# Patient Record
Sex: Male | Born: 1965
Health system: Southern US, Community
[De-identification: ages and names within clinical notes are randomized; demographics above are authoritative.]

## PROBLEM LIST (undated history)

## (undated) DIAGNOSIS — I10 Essential (primary) hypertension: Secondary | ICD-10-CM

## (undated) DIAGNOSIS — E785 Hyperlipidemia, unspecified: Secondary | ICD-10-CM

## (undated) HISTORY — DX: Hyperlipidemia, unspecified: E78.5

## (undated) HISTORY — DX: Essential (primary) hypertension: I10

## (undated) HISTORY — PX: OTHER SURGICAL HISTORY: SHX169

---

## 2005-10-02 ENCOUNTER — Emergency Department: Payer: Self-pay | Admitting: Unknown Physician Specialty

## 2007-09-24 ENCOUNTER — Other Ambulatory Visit: Payer: Self-pay

## 2007-09-24 ENCOUNTER — Emergency Department: Payer: Self-pay | Admitting: Emergency Medicine

## 2015-03-31 ENCOUNTER — Telehealth: Payer: Self-pay | Admitting: Unknown Physician Specialty

## 2015-03-31 MED ORDER — BENAZEPRIL HCL 40 MG PO TABS: 40.0000 mg | ORAL_TABLET | Freq: Every day | ORAL | Status: DC

## 2015-03-31 NOTE — Telephone Encounter (Signed)
Patient was last seen on 06/22/14, practice partner number is [phone], and pharmacy is [facility] in [place].

## 2015-03-31 NOTE — Telephone Encounter (Signed)
E-fax came through for refill on: Rx: Benazepril Copy in basket.

## 2015-03-31 NOTE — Telephone Encounter (Signed)
Needs seen further refills 

## 2015-04-04 NOTE — Telephone Encounter (Signed)
Called and left voicemail on pt's cell. The number listed in EPIC was incorrect. Copied the number from PP to the pt's EPIC chart. Will update once call is returned and appt is scheduled. Thanks.

## 2015-09-20 ENCOUNTER — Encounter: Payer: Self-pay | Admitting: Family Medicine

## 2015-09-20 ENCOUNTER — Ambulatory Visit (INDEPENDENT_AMBULATORY_CARE_PROVIDER_SITE_OTHER): Payer: 59 | Admitting: Family Medicine

## 2015-09-20 VITALS — BP 138/88 | HR 75 | Temp 98.4°F | Ht 69.3 in | Wt 220.0 lb

## 2015-09-20 DIAGNOSIS — M722 Plantar fascial fibromatosis: Secondary | ICD-10-CM

## 2015-09-20 DIAGNOSIS — I10 Essential (primary) hypertension: Secondary | ICD-10-CM | POA: Diagnosis not present

## 2015-09-20 MED ORDER — DILTIAZEM HCL 120 MG PO TABS: 120.0000 mg | ORAL_TABLET | Freq: Every day | ORAL | Status: DC

## 2015-09-20 MED ORDER — BENAZEPRIL HCL 40 MG PO TABS: 40.0000 mg | ORAL_TABLET | Freq: Every day | ORAL | Status: DC

## 2015-09-20 NOTE — Assessment & Plan Note (Signed)
Discuss poor control risk factor modification use of medications will go back to full dose recheck at physical exam

## 2015-09-20 NOTE — Progress Notes (Signed)
   BP 138/88 mmHg  Pulse 75  Temp(Src) 98.4 F (36.9 C)  Ht 5' 9.3" (1.76 m)  Wt 220 lb (99.791 kg)  BMI 32.22 kg/m2  SpO2 98%   Subjective:    Patient ID: Harold [name], male    DOB: [birth year], 50 y.o.   MRN: [id number]  HPI: Harold [name] is a 50 y.o. male  Chief Complaint  Patient presents with  . Hypertension  . Rash  . Diarrhea    happens monthly  . "bump"on right eye lid  . wife says he has some hearing loss  . plantar faciatis   patient with multiple concerns has been taking blood pressure medicines intermittently and only about a half blood pressures been elevated with home readings blood pressure had been controlled when exercising on a regular basis and weight was less ready to increase medicines again Other issues intermittent nonspecific rash in axilla last about a day then goes away occasional diarrhea with no blood Small cyst on his right eyelid First step in the morning hurts his heel with plantar fasciitis these issues were briefly reviewed with patient  Relevant past medical, surgical, family and social history reviewed and updated as indicated. Interim medical history since our last visit reviewed. Allergies and medications reviewed and updated.  Review of Systems  Constitutional: Negative.   Respiratory: Negative.   Cardiovascular: Negative.     Per HPI unless specifically indicated above     Objective:    BP 138/88 mmHg  Pulse 75  Temp(Src) 98.4 F (36.9 C)  Ht 5' 9.3" (1.76 m)  Wt 220 lb (99.791 kg)  BMI 32.22 kg/m2  SpO2 98%  Wt Readings from Last 3 Encounters:  09/20/15 220 lb (99.791 kg)  06/22/14 208 lb (94.348 kg)    Physical Exam  Constitutional: He is oriented to person, place, and time. He appears well-developed and well-nourished. No distress.  HENT:  Head: Normocephalic and atraumatic.  Right Ear: Hearing and external ear normal.  Left Ear: Hearing and external ear normal.  Nose: Nose normal.  Eyes:  Conjunctivae and lids are normal. Right eye exhibits no discharge. Left eye exhibits no discharge. No scleral icterus.  Cardiovascular: Normal rate, regular rhythm and normal heart sounds.   Pulmonary/Chest: Effort normal and breath sounds normal. No respiratory distress.  Musculoskeletal: Normal range of motion.  Neurological: He is alert and oriented to person, place, and time.  Skin: Skin is intact. No rash noted.  Psychiatric: He has a normal mood and affect. His speech is normal and behavior is normal. Judgment and thought content normal. Cognition and memory are normal.    No results found for this or any previous visit.    Assessment & Plan:   Problem List Items Addressed This Visit      Cardiovascular and Mediastinum   Essential hypertension - Primary    Discuss poor control risk factor modification use of medications will go back to full dose recheck at physical exam      Relevant Medications   benazepril (LOTENSIN) 40 MG tablet   diltiazem (CARDIZEM) 120 MG tablet    Other Visit Diagnoses    Plantar fasciitis        Discussed plantar fasciitis care and treatment        Follow up plan: Return in about 3 months (around 12/18/2015) for Physical Exam.

## 2015-10-23 ENCOUNTER — Encounter: Payer: Self-pay | Admitting: Family Medicine

## 2015-12-06 ENCOUNTER — Telehealth: Payer: Self-pay

## 2015-12-06 MED ORDER — BENAZEPRIL HCL 40 MG PO TABS: 40.0000 mg | ORAL_TABLET | Freq: Every day | ORAL | Status: DC

## 2015-12-06 MED ORDER — DILTIAZEM HCL 120 MG PO TABS: 120.0000 mg | ORAL_TABLET | Freq: Every day | ORAL | Status: DC

## 2015-12-06 NOTE — Telephone Encounter (Signed)
Pharmacy is requesting a 90 day supply on Benazepril

## 2015-12-06 NOTE — Telephone Encounter (Signed)
Please change Diltiazem 120mg  to a 90 day Rx  [facility]

## 2015-12-27 ENCOUNTER — Encounter: Payer: Self-pay | Admitting: Family Medicine

## 2015-12-27 ENCOUNTER — Ambulatory Visit (INDEPENDENT_AMBULATORY_CARE_PROVIDER_SITE_OTHER): Payer: 59 | Admitting: Family Medicine

## 2015-12-27 VITALS — BP 114/80 | HR 67 | Temp 98.2°F | Ht 69.7 in | Wt 217.0 lb

## 2015-12-27 DIAGNOSIS — I10 Essential (primary) hypertension: Secondary | ICD-10-CM | POA: Diagnosis not present

## 2015-12-27 DIAGNOSIS — Z0000 Encounter for general adult medical examination without abnormal findings: Secondary | ICD-10-CM | POA: Diagnosis not present

## 2015-12-27 LAB — URINALYSIS, ROUTINE W REFLEX MICROSCOPIC
BILIRUBIN UA: NEGATIVE
GLUCOSE, UA: NEGATIVE
KETONES UA: NEGATIVE
Leukocytes, UA: NEGATIVE
Nitrite, UA: NEGATIVE
RBC UA: NEGATIVE
SPEC GRAV UA: 1.015 (ref 1.005–1.030)
UUROB: 0.2 mg/dL (ref 0.2–1.0)
pH, UA: 6 (ref 5.0–7.5)

## 2015-12-27 MED ORDER — BENAZEPRIL HCL 40 MG PO TABS: 40.0000 mg | ORAL_TABLET | Freq: Every day | ORAL | Status: DC

## 2015-12-27 MED ORDER — TRIAMCINOLONE ACETONIDE 0.1 % EX CREA: 1.0000 "application " | TOPICAL_CREAM | Freq: Two times a day (BID) | CUTANEOUS | Status: DC

## 2015-12-27 MED ORDER — DILTIAZEM HCL 120 MG PO TABS: 120.0000 mg | ORAL_TABLET | Freq: Every day | ORAL | Status: DC

## 2015-12-27 NOTE — Progress Notes (Signed)
BP 114/80 mmHg  Pulse 67  Temp(Src) 98.2 F (36.8 C)  Ht 5' 9.7" (1.77 m)  Wt 217 lb (98.431 kg)  BMI 31.42 kg/m2  SpO2 98%   Subjective:    Patient ID: Harold [name], male    DOB: 12/14/1965, 50 y.o.   MRN: [id number]  HPI: Harold [name] is a 50 y.o. male  Chief Complaint  Patient presents with  . Annual Exam  scalp ongoing about 6 mo and sprreading dry patch rt ear area.  BP no issues  Relevant past medical, surgical, family and social history reviewed and updated as indicated. Interim medical history since our last visit reviewed. Allergies and medications reviewed and updated.  Review of Systems  Constitutional: Negative.   HENT: Negative.   Eyes: Negative.   Respiratory: Negative.   Cardiovascular: Negative.   Gastrointestinal: Negative.   Endocrine: Negative.   Genitourinary: Negative.   Musculoskeletal: Negative.   Skin: Negative.   Allergic/Immunologic: Negative.   Neurological: Negative.   Hematological: Negative.   Psychiatric/Behavioral: Negative.     Per HPI unless specifically indicated above     Objective:    BP 114/80 mmHg  Pulse 67  Temp(Src) 98.2 F (36.8 C)  Ht 5' 9.7" (1.77 m)  Wt 217 lb (98.431 kg)  BMI 31.42 kg/m2  SpO2 98%  Wt Readings from Last 3 Encounters:  12/27/15 217 lb (98.431 kg)  09/20/15 220 lb (99.791 kg)  06/22/14 208 lb (94.348 kg)    Physical Exam  Constitutional: He is oriented to person, place, and time. He appears well-developed and well-nourished.  HENT:  Head: Normocephalic and atraumatic.  Right Ear: External ear normal.  Left Ear: External ear normal.  Eyes: Conjunctivae and EOM are normal. Pupils are equal, round, and reactive to light.  Neck: Normal range of motion. Neck supple.  Cardiovascular: Normal rate, regular rhythm, normal heart sounds and intact distal pulses.   Pulmonary/Chest: Effort normal and breath sounds normal.  Abdominal: Soft. Bowel sounds are normal. There is no  splenomegaly or hepatomegaly.  Genitourinary: Rectum normal, prostate normal and penis normal.  Musculoskeletal: Normal range of motion.  Neurological: He is alert and oriented to person, place, and time. He has normal reflexes.  Skin: No rash noted. No erythema.  Scalp area with some psoriasis changes.  Psychiatric: He has a normal mood and affect. His behavior is normal. Judgment and thought content normal.    No results found for this or any previous visit.    Assessment & Plan:   Problem List Items Addressed This Visit      Cardiovascular and Mediastinum   Essential hypertension    The current medical regimen is effective;  continue present plan and medications.       Relevant Medications   benazepril (LOTENSIN) 40 MG tablet   diltiazem (CARDIZEM) 120 MG tablet    Other Visit Diagnoses    Routine general medical examination at a health care facility    -  Primary    Relevant Orders    CBC with Differential/Platelet    Comprehensive metabolic panel    Lipid Panel w/o Chol/HDL Ratio    PSA    TSH    Urinalysis, Routine w reflex microscopic ([facility])      Discussed psoriasis care and treatment and use of steroid cream   Follow up plan: Return in about 6 months (around 06/28/2016), or if symptoms worsen or fail to improve, for bmp.

## 2015-12-27 NOTE — Assessment & Plan Note (Signed)
The current medical regimen is effective;  continue present plan and medications.  

## 2015-12-27 NOTE — Addendum Note (Signed)
Addended by: Lurlean HornsWILSON, Malvin Morrish H on: 12/27/2015 08:43 AM   Modules accepted: K[employer]

## 2015-12-28 ENCOUNTER — Encounter: Payer: Self-pay | Admitting: Family Medicine

## 2015-12-28 LAB — CBC WITH DIFFERENTIAL/PLATELET
BASOS: 0 %
Basophils Absolute: 0 10*3/uL (ref 0.0–0.2)
EOS (ABSOLUTE): 0 10*3/uL (ref 0.0–0.4)
EOS: 0 %
HEMATOCRIT: 44.1 % (ref 37.5–51.0)
Hemoglobin: 14.6 g/dL (ref 12.6–17.7)
IMMATURE GRANS (ABS): 0 10*3/uL (ref 0.0–0.1)
IMMATURE GRANULOCYTES: 0 %
LYMPHS: 18 %
Lymphocytes Absolute: 1.3 10*3/uL (ref 0.7–3.1)
MCH: 28.9 pg (ref 26.6–33.0)
MCHC: 33.1 g/dL (ref 31.5–35.7)
MCV: 87 fL (ref 79–97)
MONOCYTES: 6 %
MONOS ABS: 0.4 10*3/uL (ref 0.1–0.9)
NEUTROS PCT: 76 %
Neutrophils Absolute: 5.3 10*3/uL (ref 1.4–7.0)
PLATELETS: 227 10*3/uL (ref 150–379)
RBC: 5.05 x10E6/uL (ref 4.14–5.80)
RDW: 13.7 % (ref 12.3–15.4)
WBC: 7.1 10*3/uL (ref 3.4–10.8)

## 2015-12-28 LAB — COMPREHENSIVE METABOLIC PANEL
ALBUMIN: 4.6 g/dL (ref 3.5–5.5)
ALK PHOS: 58 IU/L (ref 39–117)
ALT: 39 IU/L (ref 0–44)
AST: 31 IU/L (ref 0–40)
Albumin/Globulin Ratio: 1.8 (ref 1.2–2.2)
BILIRUBIN TOTAL: 0.8 mg/dL (ref 0.0–1.2)
BUN / CREAT RATIO: 12 (ref 9–20)
BUN: 14 mg/dL (ref 6–24)
CHLORIDE: 102 mmol/L (ref 96–106)
CO2: 24 mmol/L (ref 18–29)
CREATININE: 1.19 mg/dL (ref 0.76–1.27)
Calcium: 9.5 mg/dL (ref 8.7–10.2)
GFR calc Af Amer: 84 mL/min/{1.73_m2} (ref >59)
GFR calc non Af Amer: 72 mL/min/{1.73_m2} (ref >59)
Globulin, Total: 2.5 g/dL (ref 1.5–4.5)
Glucose: 95 mg/dL (ref 65–99)
Potassium: 4.4 mmol/L (ref 3.5–5.2)
Sodium: 143 mmol/L (ref 134–144)
Total Protein: 7.1 g/dL (ref 6.0–8.5)

## 2015-12-28 LAB — LIPID PANEL W/O CHOL/HDL RATIO
Cholesterol, Total: 209 mg/dL — ABNORMAL HIGH (ref 100–199)
HDL: 53 mg/dL (ref >39)
LDL Calculated: 128 mg/dL — ABNORMAL HIGH (ref 0–99)
Triglycerides: 138 mg/dL (ref 0–149)
VLDL Cholesterol Cal: 28 mg/dL (ref 5–40)

## 2015-12-28 LAB — TSH: TSH: 1.03 u[IU]/mL (ref 0.450–4.500)

## 2015-12-28 LAB — PSA: PROSTATE SPECIFIC AG, SERUM: 0.7 ng/mL (ref 0.0–4.0)

## 2016-01-22 ENCOUNTER — Emergency Department
Admission: EM | Admit: 2016-01-22 | Discharge: 2016-01-22 | Disposition: A | Discharge status: Home / Self Care | Payer: 59 | Attending: Emergency Medicine | Admitting: Emergency Medicine

## 2016-01-22 ENCOUNTER — Encounter: Payer: Self-pay | Admitting: Emergency Medicine

## 2016-01-22 DIAGNOSIS — Y929 Unspecified place or not applicable: Secondary | ICD-10-CM | POA: Diagnosis not present

## 2016-01-22 DIAGNOSIS — S0990XA Unspecified injury of head, initial encounter: Secondary | ICD-10-CM | POA: Diagnosis not present

## 2016-01-22 DIAGNOSIS — W108XXA Fall (on) (from) other stairs and steps, initial encounter: Secondary | ICD-10-CM | POA: Insufficient documentation

## 2016-01-22 DIAGNOSIS — Y999 Unspecified external cause status: Secondary | ICD-10-CM | POA: Insufficient documentation

## 2016-01-22 DIAGNOSIS — I10 Essential (primary) hypertension: Secondary | ICD-10-CM | POA: Diagnosis not present

## 2016-01-22 DIAGNOSIS — Y939 Activity, unspecified: Secondary | ICD-10-CM | POA: Insufficient documentation

## 2016-01-22 LAB — BASIC METABOLIC PANEL
ANION GAP: 7 (ref 5–15)
BUN: 15 mg/dL (ref 6–20)
CALCIUM: 9.5 mg/dL (ref 8.9–10.3)
CO2: 27 mmol/L (ref 22–32)
Chloride: 108 mmol/L (ref 101–111)
Creatinine, Ser: 1.18 mg/dL (ref 0.61–1.24)
GFR calc Af Amer: >60 mL/min (ref >60)
GLUCOSE: 94 mg/dL (ref 65–99)
Potassium: 4.2 mmol/L (ref 3.5–5.1)
Sodium: 142 mmol/L (ref 135–145)

## 2016-01-22 LAB — CBC
HCT: 43.9 % (ref 40.0–52.0)
HEMOGLOBIN: 14.5 g/dL (ref 13.0–18.0)
MCH: 28.9 pg (ref 26.0–34.0)
MCHC: 33 g/dL (ref 32.0–36.0)
MCV: 87.6 fL (ref 80.0–100.0)
PLATELETS: 224 10*3/uL (ref 150–440)
RBC: 5.01 MIL/uL (ref 4.40–5.90)
RDW: 13.4 % (ref 11.5–14.5)
WBC: 7.5 10*3/uL (ref 3.8–10.6)

## 2016-01-22 NOTE — Discharge Instructions (Signed)
Head Injury, Adult °You have a head injury. Headaches and throwing up (vomiting) are common after a head injury. It should be easy to wake up from sleeping. Sometimes you must stay in the hospital. Most problems happen within the first 24 hours. Side effects may occur up to 7-10 days after the injury.  °WHAT ARE THE TYPES OF HEAD INJURIES? °Head injuries can be as minor as a bump. Some head injuries can be more severe. More severe head injuries include: °· A jarring injury to the brain (concussion). °· A bruise of the brain (contusion). This mean there is bleeding in the brain that can cause swelling. °· A cracked skull (skull fracture). °· Bleeding in the brain that collects, clots, and forms a bump (hematoma). °WHEN SHOULD I GET HELP RIGHT AWAY?  °· You are confused or sleepy. °· You cannot be woken up. °· You feel sick to your stomach (nauseous) or keep throwing up (vomiting). °· Your dizziness or unsteadiness is getting worse. °· You have very bad, lasting headaches that are not helped by medicine. Take medicines only as told by your doctor. °· You cannot use your arms or legs like normal. °· You cannot walk. °· You notice changes in the black spots in the center of the colored part of your eye (pupil). °· You have clear or bloody fluid coming from your nose or ears. °· You have trouble seeing. °During the next 24 hours after the injury, you must stay with someone who can watch you. This person should get help right away (call 911 in the [place] if you start to shake and are not able to control it (have seizures), you pass out, or you are unable to wake up. °HOW CAN I PREVENT A HEAD INJURY IN THE FUTURE? °· Wear seat belts. °· Wear a helmet while bike riding and playing sports like football. °· Stay away from dangerous activities around the house. °WHEN CAN I RETURN TO NORMAL ACTIVITIES AND ATHLETICS? °See your doctor before doing these activities. You should not do normal activities or play contact sports until 1  week after the following symptoms have stopped: °· Headache that does not go away. °· Dizziness. °· Poor attention. °· Confusion. °· Memory problems. °· Sickness to your stomach or throwing up. °· Tiredness. °· Fussiness. °· Bothered by bright lights or loud noises. °· Anxiousness or depression. °· Restless sleep. °MAKE SURE YOU:  °· Understand these instructions. °· Will watch your condition. °· Will get help right away if you are not doing well or get worse. °  °This information is not intended to replace advice given to you by your health care provider. Make sure you discuss any questions you have with your health care provider. °  °Document Released: 07/18/2008 Document Revised: 08/26/2014 Document Reviewed: 04/12/2013 °Elsevier Interactive Patient Education ©2016 [employer] ° °

## 2016-01-22 NOTE — ED Notes (Signed)
Pt here with c/o episodes of dizziness, "like he is going to pass out," states the dizziness made him fall down the steps yesterday, hitting his head on the steps going down, states he had an episode last week where he became very hot, was not sweating, heart rate elevated, denies cp, sob.

## 2016-01-22 NOTE — ED Notes (Signed)
AAOx3.  Skin warm and dry. NAD.  Ambulates with easy and steady gait.  MAE equally and strong. 

## 2016-01-22 NOTE — ED Provider Notes (Signed)
[facility] Emergency Department Provider Note  ____________________________________________    I have reviewed the triage vital signs and the nursing notes.   HISTORY  Chief Complaint Head injury   HPI Harold [name] is a 50 y.o. male who presents after a fall yesterday.Patient reports that he "had a spasm "and fell down the stairs yesterday and hit his head. Today he had a mild headache and his wife wanted him to be checked out. He denies neuro deficits. No change in vision. He reports he frequently has spasms in the legs which causes him to lose his balance. This has been happening for many years. He denies dizziness to me in fact reports he feels very well. No chest pain or shortness of breath.     Past Medical History  Diagnosis Date  . Hypertension     Patient Active Problem List   Diagnosis Date Noted  . Essential hypertension 09/20/2015    Past Surgical History  Procedure Laterality Date  . Wisdom teeth removed      Current Outpatient Rx  Name  Route  Sig  Dispense  Refill  . benazepril (LOTENSIN) 40 MG tablet   Oral   Take 1 tablet (40 mg total) by mouth daily.   90 tablet   4   . diltiazem (CARDIZEM) 120 MG tablet   Oral   Take 1 tablet (120 mg total) by mouth daily.   90 tablet   4     Allergies Review of patient's allergies indicates no known allergies.  Family History  Problem Relation Age of Onset  . Parkinson's disease Mother   . Hypertension Mother   . Alcohol abuse Father   . Hypertension Father     Social History Social History  Substance Use Topics  . Smoking status: Never Smoker   . Smokeless tobacco: Never Used  . Alcohol Use: No    Review of Systems  Constitutional: Negative for fever. Eyes: Negative for redness ENT: Negative for sore throat Cardiovascular: Negative for chest pain Respiratory: Negative for shortness of breath. Gastrointestinal: Negative for abdominal pain Genitourinary:  Negative for dysuria. Musculoskeletal: Negative for back pain. Skin: Negative for rash. Neurological: Negative for focal weakness Psychiatric: Mild anxiety    ____________________________________________   PHYSICAL EXAM:  VITAL SIGNS: ED Triage Vitals  Enc Vitals Group     BP 01/22/16 1015 132/82 mmHg     Pulse Rate 01/22/16 1015 66     Resp 01/22/16 1015 18     Temp 01/22/16 1015 98.8 F (37.1 C)     Temp Source 01/22/16 1015 Oral     SpO2 01/22/16 1015 97 %     Weight 01/22/16 1015 220 lb (99.791 kg)     Height 01/22/16 1015 5\' 10"  (1.778 m)     Head Cir --      Peak Flow --      Pain Score 01/22/16 1015 2     Pain Loc --      Pain Edu? --      Excl. in GC? --      Constitutional: Alert and oriented. Well appearing and in no distress.  Eyes: Conjunctivae are normal. No erythema or injection ENT   Head: Normocephalic and atraumatic.   Mouth/Throat: Mucous membranes are moist. Cardiovascular: Normal rate, regular rhythm. Normal and symmetric distal pulses are present in the upper extremities. No murmurs or rubs  Respiratory: Normal respiratory effort without tachypnea nor retractions. Breath sounds are clear and equal  bilaterally.  Gastrointestinal: Soft and non-tender in all quadrants. No distention. There is no CVA tenderness. Genitourinary: deferred Musculoskeletal: Nontender with normal range of motion in all extremities. No lower extremity tenderness nor edema. Neurologic:  Normal speech and language. No gross focal neurologic deficits are appreciated. Skin:  Skin is warm, dry and intact. No rash noted. Psychiatric: Mood and affect are normal. Patient exhibits appropriate insight and judgment.  ____________________________________________    LABS (pertinent positives/negatives)  Labs Reviewed  BASIC METABOLIC PANEL  CBC    ____________________________________________   EKG ED ECG REPORT I, Jene Every, the attending physician, personally  viewed and interpreted this ECG.  Date: 01/22/2016 EKG Time: 10:11 AM Rate: 64 Rhythm: normal sinus rhythm QRS Axis: normal Intervals: normal ST/T Wave abnormalities: normal Conduction Disturbances: none Narrative Interpretation: unremarkable    ____________________________________________    RADIOLOGY  None  ____________________________________________   PROCEDURES  Procedure(s) performed: none  Critical Care performed: none  ____________________________________________   INITIAL IMPRESSION / ASSESSMENT AND PLAN / ED COURSE  Pertinent labs & imaging results that were available during my care of the patient were reviewed by me and considered in my medical decision making (see chart for details).  Patient well-appearing and in no distress. Neuro intact. Denies headache. No dizziness. EKG is normal. Lab work is benign. No chest pain. Do not feel CT head is necessary given the patient feels very well greater than 24 hours after the event. Patient and his wife are comfortable not doing a CT scan after our discussion. He has no dizziness and vital signs are normal. Recommend outpatient follow-up with his PCP. Return precautions discussed  ____________________________________________   FINAL CLINICAL IMPRESSION(S) / ED DIAGNOSES  Final diagnoses:  Closed head injury, initial encounter          Jene Every, MD 01/22/16 1524

## 2016-02-01 ENCOUNTER — Encounter: Payer: Self-pay | Admitting: Family Medicine

## 2016-02-01 ENCOUNTER — Ambulatory Visit (INDEPENDENT_AMBULATORY_CARE_PROVIDER_SITE_OTHER): Payer: 59 | Admitting: Family Medicine

## 2016-02-01 VITALS — BP 121/82 | HR 79 | Temp 98.8°F | Wt 219.0 lb

## 2016-02-01 DIAGNOSIS — K529 Noninfective gastroenteritis and colitis, unspecified: Secondary | ICD-10-CM | POA: Diagnosis not present

## 2016-02-01 DIAGNOSIS — R252 Cramp and spasm: Secondary | ICD-10-CM | POA: Diagnosis not present

## 2016-02-01 NOTE — Assessment & Plan Note (Signed)
Some concerning features with it waking him up and having occasional blood. No weight loss. Unclear IBS or possible IBD. Will get him into GI. Referral generated today. Call with concerns.

## 2016-02-01 NOTE — Progress Notes (Signed)
BP 121/82 mmHg  Pulse 79  Temp(Src) 98.8 F (37.1 C)  Wt 219 lb (99.338 kg)  SpO2 97%   Subjective:    Patient ID: Harold [name], male    DOB: August 22, 1965, 50 y.o.   MRN: [id number]  HPI: Harold [name] is a 50 y.o. male  Chief Complaint  Patient presents with  . ER Follow Up   ER FOLLOW UP- had a spasm on 01/21/16 and fell down the stairs and hit his head. Had a mild headache, so his wife brought him into the ER on 6/5 for evaluation. Normal neuro exam. States that he frequently has spasms in his legs that make him lose his balance for years.  Time since discharge: 10 days Hospital/facility: [facility] Diagnosis: closed head injury Procedures/tests: Labs- normal, EKG- normal, no imaging Consultants: None New medications: None Discharge instructions: Follow up here  Status: better  States that he has twitches throughout his body. Occasionally worse than normal. He states that it was all over his body. A couple of times a month will have diarrhea for a few days to a couple of weeks. Has not really mentioned his diarrhea. Has not seen anyone for it. Diarrhea had been going for 2 weeks around that time. Unsure when the diarrhea comes on. When doesn't have the diarrhea, normal bowel movements. When he has it has up to 4-6 BM a day. Wakes him up at night occasionally. + belly pain- generalized, not able to describe it. Not sure if it's cramping. + Bowel urgency. + blood in his stool about 2x.   Was having more issues with diarrhea around the time that his legs spasmed and he fell down.   Relevant past medical, surgical, family and social history reviewed and updated as indicated. Interim medical history since our last visit reviewed. Allergies and medications reviewed and updated.  Review of Systems  Constitutional: Negative.   Respiratory: Negative.   Cardiovascular: Negative.   Gastrointestinal: Positive for abdominal pain, diarrhea and blood in stool. Negative for nausea,  vomiting, constipation, abdominal distention, anal bleeding and rectal pain.  Genitourinary: Negative.   Psychiatric/Behavioral: Negative.     Per HPI unless specifically indicated above     Objective:    BP 121/82 mmHg  Pulse 79  Temp(Src) 98.8 F (37.1 C)  Wt 219 lb (99.338 kg)  SpO2 97%  Wt Readings from Last 3 Encounters:  02/01/16 219 lb (99.338 kg)  01/22/16 220 lb (99.791 kg)  12/27/15 217 lb (98.431 kg)    Physical Exam  Constitutional: He is oriented to person, place, and time. He appears well-developed and well-nourished. No distress.  HENT:  Head: Normocephalic and atraumatic.  Right Ear: Hearing normal.  Left Ear: Hearing normal.  Nose: Nose normal.  Eyes: Conjunctivae and lids are normal. Right eye exhibits no discharge. Left eye exhibits no discharge. No scleral icterus.  Cardiovascular: Normal rate, regular rhythm, normal heart sounds and intact distal pulses.  Exam reveals no gallop and no friction rub.   No murmur heard. Pulmonary/Chest: Effort normal and breath sounds normal. No respiratory distress. He has no wheezes. He has no rales. He exhibits no tenderness.  Abdominal: Soft. Bowel sounds are normal. He exhibits no distension and no mass. There is no tenderness. There is no rebound and no guarding.  Musculoskeletal: Normal range of motion.  Neurological: He is alert and oriented to person, place, and time.  Skin: Skin is warm, dry and intact. No rash noted. He is not  diaphoretic. No erythema. No pallor.  Psychiatric: He has a normal mood and affect. His speech is normal and behavior is normal. Judgment and thought content normal. Cognition and memory are normal.  Nursing note and vitals reviewed.   Results for orders placed or performed during the hospital encounter of 01/22/16  Basic metabolic panel  Result Value Ref Range   Sodium 142 135 - 145 mmol/L   Potassium 4.2 3.5 - 5.1 mmol/L   Chloride 108 101 - 111 mmol/L   CO2 27 22 - 32 mmol/L    Glucose, Bld 94 65 - 99 mg/dL   BUN 15 6 - 20 mg/dL   Creatinine, Ser 2.13 0.61 - 1.24 mg/dL   Calcium 9.5 8.9 - 08.6 mg/dL   GFR calc non Af Amer >60 >60 mL/min   GFR calc Af Amer >60 >60 mL/min   Anion gap 7 5 - 15  CBC  Result Value Ref Range   WBC 7.5 3.8 - 10.6 K/uL   RBC 5.01 4.40 - 5.90 MIL/uL   Hemoglobin 14.5 13.0 - 18.0 g/dL   HCT 57.8 46.9 - 62.9 %   MCV 87.6 80.0 - 100.0 fL   MCH 28.9 26.0 - 34.0 pg   MCHC 33.0 32.0 - 36.0 g/dL   RDW 52.8 41.3 - 24.4 %   Platelets 224 150 - 440 K/uL      Assessment & Plan:   Problem List Items Addressed This Visit      Digestive   Chronic diarrhea - Primary    Some concerning features with it waking him up and having occasional blood. No weight loss. Unclear IBS or possible IBD. Will get him into GI. Referral generated today. Call with concerns.       Relevant Orders   Ambulatory referral to Gastroenterology    Other Visit Diagnoses    Cramp of both lower extremities        Likely due to electrolyte imbalance with his diarrhea. Will check labs. To drink a gatorade every day he has diarrhea. Call with concerns.     Relevant Orders    Comprehensive metabolic panel    VITAMIN D 25 Hydroxy (Vit-D Deficiency, Fractures)    Phosphorus    Magnesium        Follow up plan: Return if symptoms worsen or fail to improve.

## 2016-02-02 ENCOUNTER — Telehealth: Payer: Self-pay | Admitting: Family Medicine

## 2016-02-02 DIAGNOSIS — E559 Vitamin D deficiency, unspecified: Secondary | ICD-10-CM | POA: Insufficient documentation

## 2016-02-02 LAB — COMPREHENSIVE METABOLIC PANEL
ALBUMIN: 4.6 g/dL (ref 3.5–5.5)
ALK PHOS: 60 IU/L (ref 39–117)
ALT: 24 IU/L (ref 0–44)
AST: 20 IU/L (ref 0–40)
Albumin/Globulin Ratio: 2.2 (ref 1.2–2.2)
BILIRUBIN TOTAL: 1 mg/dL (ref 0.0–1.2)
BUN / CREAT RATIO: 10 (ref 9–20)
BUN: 12 mg/dL (ref 6–24)
CO2: 22 mmol/L (ref 18–29)
CREATININE: 1.18 mg/dL (ref 0.76–1.27)
Calcium: 9.6 mg/dL (ref 8.7–10.2)
Chloride: 102 mmol/L (ref 96–106)
GFR calc non Af Amer: 73 mL/min/{1.73_m2} (ref >59)
GFR, EST AFRICAN AMERICAN: 84 mL/min/{1.73_m2} (ref >59)
GLOBULIN, TOTAL: 2.1 g/dL (ref 1.5–4.5)
GLUCOSE: 91 mg/dL (ref 65–99)
Potassium: 4.3 mmol/L (ref 3.5–5.2)
SODIUM: 143 mmol/L (ref 134–144)
TOTAL PROTEIN: 6.7 g/dL (ref 6.0–8.5)

## 2016-02-02 LAB — PHOSPHORUS: Phosphorus: 3 mg/dL (ref 2.5–4.5)

## 2016-02-02 LAB — MAGNESIUM: Magnesium: 2.2 mg/dL (ref 1.6–2.3)

## 2016-02-02 LAB — VITAMIN D 25 HYDROXY (VIT D DEFICIENCY, FRACTURES): Vit D, 25-Hydroxy: 18.4 ng/mL — ABNORMAL LOW (ref 30.0–100.0)

## 2016-02-02 MED ORDER — VITAMIN D (ERGOCALCIFEROL) 1.25 MG (50000 UNIT) PO CAPS: 50000.0000 [IU] | ORAL_CAPSULE | ORAL | Status: DC

## 2016-02-02 NOTE — Telephone Encounter (Signed)
Patient notified

## 2016-02-02 NOTE — Telephone Encounter (Signed)
Please let him know that his labs were normal except his vitamin D which is quite low. I've sent him through a weekly medicine. We'll recheck it at his follow up.

## 2016-07-01 ENCOUNTER — Encounter: Payer: Self-pay | Admitting: Family Medicine

## 2016-07-01 ENCOUNTER — Ambulatory Visit (INDEPENDENT_AMBULATORY_CARE_PROVIDER_SITE_OTHER): Payer: 59 | Admitting: Family Medicine

## 2016-07-01 VITALS — BP 116/80 | HR 73 | Temp 98.5°F | Wt 215.0 lb

## 2016-07-01 DIAGNOSIS — K529 Noninfective gastroenteritis and colitis, unspecified: Secondary | ICD-10-CM

## 2016-07-01 DIAGNOSIS — I10 Essential (primary) hypertension: Secondary | ICD-10-CM | POA: Diagnosis not present

## 2016-07-01 DIAGNOSIS — E559 Vitamin D deficiency, unspecified: Secondary | ICD-10-CM | POA: Diagnosis not present

## 2016-07-01 NOTE — Assessment & Plan Note (Signed)
The current medical regimen is effective;  continue present plan and medications.  

## 2016-07-01 NOTE — Assessment & Plan Note (Signed)
Reviewed and patient stable and doing well.

## 2016-07-01 NOTE — Progress Notes (Signed)
BP 116/80   Pulse 73   Temp 98.5 F (36.9 C)   Wt 215 lb (97.5 kg)   SpO2 97%   BMI 30.85 kg/m    Subjective:    Patient ID: Harold [name], male  Harold [name]  DOB: 1966-05-24, 50 y.o.   MRN: [id number]  HPI: Harold HeaterOssie O Dubois [name] is a 50 y.o. male  Chief Complaint  Patient presents with  . Hypertension   Patient follow-up hypertension no complaints from medications taking diltiazem 60 mg one half of 120 and also Benzapril 20 mg a half of 40. Good control of blood pressure with no side effects or issues. Has tried reducing and he gets too high tried increasing and gets too low as found good balance.  Patient's irritable bowel is steady with only every other month of severe diarrhea otherwise mild loose stools 2-3 times a week otherwise normal.  Relevant past medical, surgical, family and social history reviewed and updated as indicated. Interim medical history since our last visit reviewed. Allergies and medications reviewed and updated.  Review of Systems  Constitutional: Negative.   Respiratory: Negative.   Cardiovascular: Negative.     Per HPI unless specifically indicated above     Objective:    BP 116/80   Pulse 73   Temp 98.5 F (36.9 C)   Wt 215 lb (97.5 kg)   SpO2 97%   BMI 30.85 kg/m   Wt Readings from Last 3 Encounters:  07/01/16 215 lb (97.5 kg)  02/01/16 219 lb (99.3 kg)  01/22/16 220 lb (99.8 kg)    Physical Exam  Constitutional: He is oriented to person, place, and time. He appears well-developed and well-nourished. No distress.  HENT:  Head: Normocephalic and atraumatic.  Right Ear: Hearing normal.  Left Ear: Hearing normal.  Nose: Nose normal.  Eyes: Conjunctivae and lids are normal. Right eye exhibits no discharge. Left eye exhibits no discharge. No scleral icterus.  Cardiovascular: Normal rate, regular rhythm and normal heart sounds.   Pulmonary/Chest: Effort normal and breath sounds normal. No respiratory distress.  Musculoskeletal: Normal  range of motion.  Neurological: He is alert and oriented to person, place, and time.  Skin: Skin is intact. No rash noted.  Psychiatric: He has a normal mood and affect. His speech is normal and behavior is normal. Judgment and thought content normal. Cognition and memory are normal.    Results for orders placed or performed in visit on 02/01/16  Comprehensive metabolic panel  Result Value Ref Range   Glucose 91 65 - 99 mg/dL   BUN 12 6 - 24 mg/dL   Creatinine, Ser 3.081.18 0.76 - 1.27 mg/dL   GFR calc non Af Amer 73 >59 mL/min/1.73   GFR calc Af Amer 84 >59 mL/min/1.73   BUN/Creatinine Ratio 10 9 - 20   Sodium 143 134 - 144 mmol/L   Potassium 4.3 3.5 - 5.2 mmol/L   Chloride 102 96 - 106 mmol/L   CO2 22 18 - 29 mmol/L   Calcium 9.6 8.7 - 10.2 mg/dL   Total Protein 6.7 6.0 - 8.5 g/dL   Albumin 4.6 3.5 - 5.5 g/dL   Globulin, Total 2.1 1.5 - 4.5 g/dL   Albumin/Globulin Ratio 2.2 1.2 - 2.2   Bilirubin Total 1.0 0.0 - 1.2 mg/dL   Alkaline Phosphatase 60 39 - 117 IU/L   AST 20 0 - 40 IU/L   ALT 24 0 - 44 IU/L  VITAMIN D 25 Hydroxy (Vit-D Deficiency, Fractures)  Result Value Ref Range   Vit D, 25-Hydroxy 18.4 (L) 30.0 - 100.0 ng/mL  Phosphorus  Result Value Ref Range   Phosphorus 3.0 2.5 - 4.5 mg/dL  Magnesium  Result Value Ref Range   Magnesium 2.2 1.6 - 2.3 mg/dL      Assessment & Plan:   Problem List Items Addressed This Visit      Cardiovascular and Mediastinum   Essential hypertension - Primary    The current medical regimen is effective;  continue present plan and medications.       Relevant Orders   Basic metabolic panel     Digestive   Chronic diarrhea    Reviewed and patient stable and doing well.        Other   Vitamin D deficiency       Follow up plan: Return in about 6 months (around 12/29/2016) for Physical Exam.

## 2016-07-02 ENCOUNTER — Encounter: Payer: Self-pay | Admitting: Family Medicine

## 2016-07-02 LAB — BASIC METABOLIC PANEL
BUN / CREAT RATIO: 15 (ref 9–20)
BUN: 16 mg/dL (ref 6–24)
CO2: 22 mmol/L (ref 18–29)
CREATININE: 1.05 mg/dL (ref 0.76–1.27)
Calcium: 9.9 mg/dL (ref 8.7–10.2)
Chloride: 103 mmol/L (ref 96–106)
GFR calc Af Amer: 97 mL/min/{1.73_m2} (ref >59)
GFR, EST NON AFRICAN AMERICAN: 84 mL/min/{1.73_m2} (ref >59)
Glucose: 83 mg/dL (ref 65–99)
Potassium: 4.1 mmol/L (ref 3.5–5.2)
SODIUM: 146 mmol/L — AB (ref 134–144)

## 2016-07-02 LAB — VITAMIN D 25 HYDROXY (VIT D DEFICIENCY, FRACTURES): Vit D, 25-Hydroxy: 22.7 ng/mL — ABNORMAL LOW (ref 30.0–100.0)

## 2016-12-30 ENCOUNTER — Encounter: Payer: 59 | Admitting: Family Medicine

## 2017-01-09 ENCOUNTER — Encounter: Payer: Self-pay | Admitting: Family Medicine

## 2017-01-09 ENCOUNTER — Ambulatory Visit (INDEPENDENT_AMBULATORY_CARE_PROVIDER_SITE_OTHER): Payer: 59 | Admitting: Family Medicine

## 2017-01-09 VITALS — BP 115/76 | HR 73 | Ht 70.08 in | Wt 216.0 lb

## 2017-01-09 DIAGNOSIS — I10 Essential (primary) hypertension: Secondary | ICD-10-CM

## 2017-01-09 DIAGNOSIS — Z1329 Encounter for screening for other suspected endocrine disorder: Secondary | ICD-10-CM | POA: Diagnosis not present

## 2017-01-09 DIAGNOSIS — L989 Disorder of the skin and subcutaneous tissue, unspecified: Secondary | ICD-10-CM | POA: Diagnosis not present

## 2017-01-09 DIAGNOSIS — Z13220 Encounter for screening for lipoid disorders: Secondary | ICD-10-CM | POA: Diagnosis not present

## 2017-01-09 DIAGNOSIS — Z79899 Other long term (current) drug therapy: Secondary | ICD-10-CM | POA: Diagnosis not present

## 2017-01-09 DIAGNOSIS — Z125 Encounter for screening for malignant neoplasm of prostate: Secondary | ICD-10-CM | POA: Diagnosis not present

## 2017-01-09 DIAGNOSIS — Z0000 Encounter for general adult medical examination without abnormal findings: Secondary | ICD-10-CM

## 2017-01-09 DIAGNOSIS — Z0001 Encounter for general adult medical examination with abnormal findings: Secondary | ICD-10-CM | POA: Diagnosis not present

## 2017-01-09 LAB — URINALYSIS, ROUTINE W REFLEX MICROSCOPIC
Bilirubin, UA: NEGATIVE
GLUCOSE, UA: NEGATIVE
Ketones, UA: NEGATIVE
Leukocytes, UA: NEGATIVE
Nitrite, UA: NEGATIVE
PROTEIN UA: NEGATIVE
RBC, UA: NEGATIVE
Specific Gravity, UA: >1.03 — ABNORMAL HIGH (ref 1.005–1.030)
Urobilinogen, Ur: 0.2 mg/dL (ref 0.2–1.0)
pH, UA: 5.5 (ref 5.0–7.5)

## 2017-01-09 LAB — MICROSCOPIC EXAMINATION: BACTERIA UA: NONE SEEN

## 2017-01-09 MED ORDER — HYDROCHLOROTHIAZIDE 25 MG PO TABS: 25.0000 mg | ORAL_TABLET | Freq: Every day | ORAL | 2 refills | Status: DC

## 2017-01-09 MED ORDER — BENAZEPRIL HCL 40 MG PO TABS: 40.0000 mg | ORAL_TABLET | Freq: Every day | ORAL | 4 refills | Status: DC

## 2017-01-09 NOTE — Assessment & Plan Note (Signed)
Discuss hypertension poor control with home checking and lack of exercise. Discuss weight loss diet exercise nutrition modest salt Discussed with patient concerned about moderating and adding medication wants to stop and change so will stop diltiazem for now will add hydrochlorothiazide 25 mg if blood pressure still spiking will add back diltiazem recheck patient in one to 2 months.

## 2017-01-09 NOTE — Progress Notes (Signed)
BP 115/76   Pulse 73   Ht 5' 10.08" (1.78 m)   Wt 216 lb (98 kg)   SpO2 98%   BMI 30.92 kg/m    Subjective:    Patient ID: Harold [name], male    DOB: [birth year], 51 y.o.   MRN: [id number]  HPI: Harold [name] is a 51 y.o. male  Chief Complaint  Patient presents with  . Annual Exam  . Hypertension   has flaking patch right posterior ear area has used triamcinolone with no real improvement and using another kind of cream now and still is present and reoccurs in a day or 2. She is in these creams. Patient also is noted blood pressure concerns with triple digits both systolic and diastolic on days that he is unable to walk has had some job changes and job stress which is resulted in elevated blood pressures. Patient for a while was able to decrease both Benzapril and diltiazem by half with good blood pressure control now with full dose not having good control most days.  Relevant past medical, surgical, family and social history reviewed and updated as indicated. Interim medical history since our last visit reviewed. Allergies and medications reviewed and updated.  Review of Systems  Constitutional: Negative.   HENT: Negative.   Eyes: Negative.   Respiratory: Negative.   Cardiovascular: Negative.   Gastrointestinal: Negative.   Endocrine: Negative.   Genitourinary: Negative.   Musculoskeletal: Negative.   Skin: Negative.   Allergic/Immunologic: Negative.   Neurological: Negative.   Hematological: Negative.   Psychiatric/Behavioral: Negative.     Per HPI unless specifically indicated above     Objective:    BP 115/76   Pulse 73   Ht 5' 10.08" (1.78 m)   Wt 216 lb (98 kg)   SpO2 98%   BMI 30.92 kg/m   Wt Readings from Last 3 Encounters:  01/09/17 216 lb (98 kg)  07/01/16 215 lb (97.5 kg)  02/01/16 219 lb (99.3 kg)    Physical Exam  Constitutional: He is oriented to person, place, and time. He appears well-developed and well-nourished.  HENT:    Head: Normocephalic and atraumatic.  Right Ear: External ear normal.  Left Ear: External ear normal.  Eyes: Conjunctivae and EOM are normal. Pupils are equal, round, and reactive to light.  Neck: Normal range of motion. Neck supple.  Cardiovascular: Normal rate, regular rhythm, normal heart sounds and intact distal pulses.   Pulmonary/Chest: Effort normal and breath sounds normal.  Abdominal: Soft. Bowel sounds are normal. There is no splenomegaly or hepatomegaly.  Genitourinary: Rectum normal, prostate normal and penis normal.  Musculoskeletal: Normal range of motion.  Neurological: He is alert and oriented to person, place, and time. He has normal reflexes.  Skin: No rash noted. No erythema.  Psychiatric: He has a normal mood and affect. His behavior is normal. Judgment and thought content normal.    Results for orders placed or performed in visit on 07/01/16  VITAMIN D 25 Hydroxy (Vit-D Deficiency, Fractures)  Result Value Ref Range   Vit D, 25-Hydroxy 22.7 (L) 30.0 - 100.0 ng/mL  Basic metabolic panel  Result Value Ref Range   Glucose 83 65 - 99 mg/dL   BUN 16 6 - 24 mg/dL   Creatinine, Ser 7.82 0.76 - 1.27 mg/dL   GFR calc non Af Amer 84 >59 mL/min/1.73   GFR calc Af Amer 97 >59 mL/min/1.73   BUN/Creatinine Ratio 15 9 - 20  Sodium 146 (H) 134 - 144 mmol/L   Potassium 4.1 3.5 - 5.2 mmol/L   Chloride 103 96 - 106 mmol/L   CO2 22 18 - 29 mmol/L   Calcium 9.9 8.7 - 10.2 mg/dL      Assessment & Plan:   Problem List Items Addressed This Visit      Cardiovascular and Mediastinum   Essential hypertension    Discuss hypertension poor control with home checking and lack of exercise. Discuss weight loss diet exercise nutrition modest salt Discussed with patient concerned about moderating and adding medication wants to stop and change so will stop diltiazem for now will add hydrochlorothiazide 25 mg if blood pressure still spiking will add back diltiazem recheck patient in one to  2 months.      Relevant Medications   benazepril (LOTENSIN) 40 MG tablet   hydrochlorothiazide (HYDRODIURIL) 25 MG tablet   Other Relevant Orders   CBC with Differential/Platelet   Comprehensive metabolic panel   Urinalysis, Routine w reflex microscopic     Musculoskeletal and Integument   Scalp lesion    Nonhealing scalp scaly patch will refer to dermatology to further evaluate.      Relevant Orders   Ambulatory referral to Dermatology    Other Visit Diagnoses    Annual physical exam    -  Primary   Screening cholesterol level       Relevant Orders   Lipid panel   Thyroid disorder screen       Relevant Orders   TSH   Prostate cancer screening       Relevant Orders   PSA   Medication management       Relevant Orders   CBC with Differential/Platelet   Comprehensive metabolic panel   Urinalysis, Routine w reflex microscopic       Follow up plan: Return in about 2 months (around 03/11/2017) for BMP.

## 2017-01-09 NOTE — Assessment & Plan Note (Signed)
Nonhealing scalp scaly patch will refer to dermatology to further evaluate.

## 2017-01-10 LAB — COMPREHENSIVE METABOLIC PANEL
A/G RATIO: 1.7 (ref 1.2–2.2)
ALK PHOS: 61 IU/L (ref 39–117)
ALT: 28 IU/L (ref 0–44)
AST: 16 IU/L (ref 0–40)
Albumin: 4.2 g/dL (ref 3.5–5.5)
BUN/Creatinine Ratio: 15 (ref 9–20)
BUN: 17 mg/dL (ref 6–24)
Bilirubin Total: 0.8 mg/dL (ref 0.0–1.2)
CALCIUM: 8.9 mg/dL (ref 8.7–10.2)
CO2: 23 mmol/L (ref 18–29)
Chloride: 104 mmol/L (ref 96–106)
Creatinine, Ser: 1.15 mg/dL (ref 0.76–1.27)
GFR calc Af Amer: 87 mL/min/{1.73_m2} (ref >59)
GFR calc non Af Amer: 75 mL/min/{1.73_m2} (ref >59)
GLOBULIN, TOTAL: 2.5 g/dL (ref 1.5–4.5)
Glucose: 133 mg/dL — ABNORMAL HIGH (ref 65–99)
POTASSIUM: 4 mmol/L (ref 3.5–5.2)
SODIUM: 141 mmol/L (ref 134–144)
Total Protein: 6.7 g/dL (ref 6.0–8.5)

## 2017-01-10 LAB — LIPID PANEL
CHOLESTEROL TOTAL: 194 mg/dL (ref 100–199)
Chol/HDL Ratio: 4.5 ratio (ref 0.0–5.0)
HDL: 43 mg/dL (ref >39)
LDL Calculated: 95 mg/dL (ref 0–99)
TRIGLYCERIDES: 282 mg/dL — AB (ref 0–149)
VLDL Cholesterol Cal: 56 mg/dL — ABNORMAL HIGH (ref 5–40)

## 2017-01-10 LAB — CBC WITH DIFFERENTIAL/PLATELET
Basophils Absolute: 0 10*3/uL (ref 0.0–0.2)
Basos: 0 %
EOS (ABSOLUTE): 0 10*3/uL (ref 0.0–0.4)
EOS: 0 %
HEMATOCRIT: 41.3 % (ref 37.5–51.0)
Hemoglobin: 13.8 g/dL (ref 13.0–17.7)
IMMATURE GRANULOCYTES: 0 %
Immature Grans (Abs): 0 10*3/uL (ref 0.0–0.1)
LYMPHS ABS: 1.9 10*3/uL (ref 0.7–3.1)
Lymphs: 28 %
MCH: 29.2 pg (ref 26.6–33.0)
MCHC: 33.4 g/dL (ref 31.5–35.7)
MCV: 87 fL (ref 79–97)
MONOS ABS: 0.5 10*3/uL (ref 0.1–0.9)
Monocytes: 7 %
NEUTROS PCT: 65 %
Neutrophils Absolute: 4.4 10*3/uL (ref 1.4–7.0)
PLATELETS: 234 10*3/uL (ref 150–379)
RBC: 4.73 x10E6/uL (ref 4.14–5.80)
RDW: 14 % (ref 12.3–15.4)
WBC: 6.7 10*3/uL (ref 3.4–10.8)

## 2017-01-10 LAB — PSA: Prostate Specific Ag, Serum: 0.7 ng/mL (ref 0.0–4.0)

## 2017-01-10 LAB — TSH: TSH: 0.813 u[IU]/mL (ref 0.450–4.500)

## 2017-01-11 ENCOUNTER — Other Ambulatory Visit: Payer: Self-pay | Admitting: Family Medicine

## 2017-01-14 ENCOUNTER — Telehealth: Payer: Self-pay | Admitting: Family Medicine

## 2017-01-14 DIAGNOSIS — R7309 Other abnormal glucose: Secondary | ICD-10-CM

## 2017-01-14 NOTE — Telephone Encounter (Signed)
Phone call Discussed with patient elevated glucose nonfasting patient had been a doughnut just prior to office visit patient will come back for hemoglobin A1c.

## 2017-01-23 ENCOUNTER — Other Ambulatory Visit: Payer: 59

## 2017-01-23 DIAGNOSIS — R7309 Other abnormal glucose: Secondary | ICD-10-CM

## 2017-01-24 LAB — HGB A1C W/O EAG: Hgb A1c MFr Bld: 5.7 % — ABNORMAL HIGH (ref 4.8–5.6)

## 2017-01-25 ENCOUNTER — Other Ambulatory Visit: Payer: Self-pay | Admitting: Family Medicine

## 2017-03-19 ENCOUNTER — Ambulatory Visit (INDEPENDENT_AMBULATORY_CARE_PROVIDER_SITE_OTHER): Payer: 59 | Admitting: Family Medicine

## 2017-03-19 ENCOUNTER — Encounter: Payer: Self-pay | Admitting: Family Medicine

## 2017-03-19 VITALS — BP 124/79 | HR 68 | Wt 220.0 lb

## 2017-03-19 DIAGNOSIS — R7303 Prediabetes: Secondary | ICD-10-CM | POA: Insufficient documentation

## 2017-03-19 DIAGNOSIS — I10 Essential (primary) hypertension: Secondary | ICD-10-CM

## 2017-03-19 MED ORDER — HYDROCHLOROTHIAZIDE 25 MG PO TABS: 25.0000 mg | ORAL_TABLET | Freq: Every day | ORAL | 3 refills | Status: DC

## 2017-03-19 NOTE — Assessment & Plan Note (Signed)
Discuss prediabetes diet exercise nutrition!

## 2017-03-19 NOTE — Assessment & Plan Note (Signed)
Discuss hypertension and medication will continue hydrochlorothiazide will check BMP to assess electrolytes.

## 2017-03-19 NOTE — Progress Notes (Signed)
   BP 124/79   Pulse 68   Wt 220 lb (99.8 kg)   SpO2 99%   BMI 31.50 kg/m    Subjective:    Patient ID: Harold [name], male    DOB: 1965/11/22, 51 y.o.   MRN: [id number]  HPI: Harold [name] is a 51 y.o. male  Chief Complaint  Patient presents with  . Follow-up  Patient doing well good blood pressure control stopping diltiazem and starting hydrochlorothiazide is worked out well for blood pressure. No side effects no issues or concerns. Patient also didn't hear about glucose recheck with A1c attempt point into prediabetes range.  Relevant past medical, surgical, family and social history reviewed and updated as indicated. Interim medical history since our last visit reviewed. Allergies and medications reviewed and updated.  Review of Systems  Constitutional: Negative.   Respiratory: Negative.   Cardiovascular: Negative.     Per HPI unless specifically indicated above     Objective:    BP 124/79   Pulse 68   Wt 220 lb (99.8 kg)   SpO2 99%   BMI 31.50 kg/m   Wt Readings from Last 3 Encounters:  03/19/17 220 lb (99.8 kg)  01/09/17 216 lb (98 kg)  07/01/16 215 lb (97.5 kg)    Physical Exam  Constitutional: He is oriented to person, place, and time. He appears well-developed and well-nourished.  HENT:  Head: Normocephalic and atraumatic.  Eyes: Conjunctivae and EOM are normal.  Neck: Normal range of motion.  Cardiovascular: Normal rate, regular rhythm and normal heart sounds.   Pulmonary/Chest: Effort normal and breath sounds normal.  Musculoskeletal: Normal range of motion.  Neurological: He is alert and oriented to person, place, and time.  Skin: No erythema.  Psychiatric: He has a normal mood and affect. His behavior is normal. Judgment and thought content normal.    Results for orders placed or performed in visit on 01/23/17  Hgb A1c w/o eAG  Result Value Ref Range   Hgb A1c MFr Bld 5.7 (H) 4.8 - 5.6 %      Assessment & Plan:   Problem  List Items Addressed This Visit      Cardiovascular and Mediastinum   Essential hypertension - Primary    Discuss hypertension and medication will continue hydrochlorothiazide will check BMP to assess electrolytes.      Relevant Medications   hydrochlorothiazide (HYDRODIURIL) 25 MG tablet   Other Relevant Orders   Basic metabolic panel     Other   Pre-diabetes    Discuss prediabetes diet exercise nutrition!        Discuss prediabetes  Follow up plan: Return for BMP, Nov and BP.

## 2017-03-20 ENCOUNTER — Encounter: Payer: Self-pay | Admitting: Family Medicine

## 2017-03-20 LAB — BASIC METABOLIC PANEL
BUN/Creatinine Ratio: 16 (ref 9–20)
BUN: 18 mg/dL (ref 6–24)
CALCIUM: 9.6 mg/dL (ref 8.7–10.2)
CO2: 24 mmol/L (ref 20–29)
CREATININE: 1.12 mg/dL (ref 0.76–1.27)
Chloride: 101 mmol/L (ref 96–106)
GFR calc Af Amer: 89 mL/min/{1.73_m2} (ref >59)
GFR, EST NON AFRICAN AMERICAN: 77 mL/min/{1.73_m2} (ref >59)
GLUCOSE: 95 mg/dL (ref 65–99)
Potassium: 3.8 mmol/L (ref 3.5–5.2)
Sodium: 141 mmol/L (ref 134–144)

## 2017-05-05 DIAGNOSIS — L400 Psoriasis vulgaris: Secondary | ICD-10-CM | POA: Diagnosis not present

## 2017-06-25 ENCOUNTER — Encounter: Payer: Self-pay | Admitting: Family Medicine

## 2017-06-25 ENCOUNTER — Ambulatory Visit (INDEPENDENT_AMBULATORY_CARE_PROVIDER_SITE_OTHER): Payer: 59 | Admitting: Family Medicine

## 2017-06-25 VITALS — BP 126/92 | HR 76 | Temp 99.0°F | Wt 222.0 lb

## 2017-06-25 DIAGNOSIS — R519 Headache, unspecified: Secondary | ICD-10-CM

## 2017-06-25 DIAGNOSIS — R51 Headache: Secondary | ICD-10-CM

## 2017-06-25 DIAGNOSIS — H66001 Acute suppurative otitis media without spontaneous rupture of ear drum, right ear: Secondary | ICD-10-CM

## 2017-06-25 MED ORDER — FLUTICASONE PROPIONATE 50 MCG/ACT NA SUSP: 2.0000 | Freq: Every day | NASAL | 0 refills | Status: DC

## 2017-06-25 MED ORDER — AMOXICILLIN 875 MG PO TABS: 875.0000 mg | ORAL_TABLET | Freq: Two times a day (BID) | ORAL | 0 refills | Status: DC

## 2017-06-25 MED ORDER — PSEUDOEPHEDRINE HCL 30 MG PO TABS: 30.0000 mg | ORAL_TABLET | ORAL | 0 refills | Status: DC | PRN

## 2017-06-25 NOTE — Progress Notes (Signed)
BP (!) 126/92   Pulse 76   Temp 99 F (37.2 C) (Oral)   Wt 222 lb (100.7 kg)   SpO2 98%   BMI 31.78 kg/m    Subjective:    Patient ID: Harold [name], male    DOB: 1966-08-16, 51 y.o.   MRN: [id number]  HPI: Harold HeaterOssie O Lottman [name] is a 51 y.o. male  Chief Complaint  Patient presents with  . Ear Fullness    Rightness, was seen at [facility] x 2 weeks ago  . Headache    Left side tenderness that shoots down to L. ear.    Patient presents with several weeks of right ear fullness, pain, and pressure. Was seen at [facility] 2 weks ago and given antibiotic drops for possible otitis externa which have not helped. Now also having sore throat and slight cough. Also having left scalp tenderness that shoots down toward left ear for a few weeks. Was given fluocinolne oil for dry scalp right before both of these things started. Denies visual changes, hearing loss, fevers, rashes.   Relevant past medical, surgical, family and social history reviewed and updated as indicated. Interim medical history since our last visit reviewed. Allergies and medications reviewed and updated.  Review of Systems  Constitutional: Negative.   HENT: Positive for ear pain.        Scalp tenderness  Respiratory: Negative.   Cardiovascular: Negative.   Gastrointestinal: Negative.   Genitourinary: Negative.   Musculoskeletal: Negative.   Neurological: Negative.   Psychiatric/Behavioral: Negative.    Per HPI unless specifically indicated above     Objective:    BP (!) 126/92   Pulse 76   Temp 99 F (37.2 C) (Oral)   Wt 222 lb (100.7 kg)   SpO2 98%   BMI 31.78 kg/m   Wt Readings from Last 3 Encounters:  06/25/17 222 lb (100.7 kg)  03/19/17 220 lb (99.8 kg)  01/09/17 216 lb (98 kg)    Physical Exam  Constitutional: He is oriented to person, place, and time. He appears well-developed and well-nourished. No distress.  HENT:  Right TM bulging with thick fluid visible Left TM benign Left temporal scalp ttp  down toward ear  Eyes: Conjunctivae are normal. Pupils are equal, round, and reactive to light.  Neck: Normal range of motion. Neck supple.  Cardiovascular: Normal rate and normal heart sounds.  Pulmonary/Chest: Effort normal and breath sounds normal.  Musculoskeletal: Normal range of motion.  Lymphadenopathy:    He has no cervical adenopathy.  Neurological: He is alert and oriented to person, place, and time.  Skin: Skin is warm and dry. No rash noted. No erythema.  Psychiatric: He has a normal mood and affect. His behavior is normal.  Nursing note and vitals reviewed.  Results for orders placed or performed in visit on 03/19/17  Basic metabolic panel  Result Value Ref Range   Glucose 95 65 - 99 mg/dL   BUN 18 6 - 24 mg/dL   Creatinine, Ser 7.821.12 0.76 - 1.27 mg/dL   GFR calc non Af Amer 77 >59 mL/min/1.73   GFR calc Af Amer 89 >59 mL/min/1.73   BUN/Creatinine Ratio 16 9 - 20   Sodium 141 134 - 144 mmol/L   Potassium 3.8 3.5 - 5.2 mmol/L   Chloride 101 96 - 106 mmol/L   CO2 24 20 - 29 mmol/L   Calcium 9.6 8.7 - 10.2 mg/dL      Assessment & Plan:   Problem List  Items Addressed This Visit    None    Visit Diagnoses    Acute suppurative otitis media of right ear without spontaneous rupture of tympanic membrane, recurrence not specified    -  Primary   Will treat with course of amoxicillin, sudafed, and flonase and monitor closely for benefit. F/u if worsening or no improvement   Relevant Medications   amoxicillin (AMOXIL) 875 MG tablet   Scalp tenderness       Possibly muscle tightness or from scalp oil. Sees dermatology tomorrow and will ask at that time. Continue d/c of scalp oil, start warm compresses, massage       Follow up plan: Return if symptoms worsen or fail to improve.

## 2017-06-26 DIAGNOSIS — L400 Psoriasis vulgaris: Secondary | ICD-10-CM | POA: Diagnosis not present

## 2017-06-28 NOTE — Patient Instructions (Signed)
Follow up as needed

## 2017-07-03 ENCOUNTER — Ambulatory Visit: Payer: 59 | Admitting: Family Medicine

## 2017-07-04 ENCOUNTER — Encounter: Payer: Self-pay | Admitting: Family Medicine

## 2017-07-04 ENCOUNTER — Ambulatory Visit (INDEPENDENT_AMBULATORY_CARE_PROVIDER_SITE_OTHER): Payer: 59 | Admitting: Family Medicine

## 2017-07-04 VITALS — BP 105/72 | HR 80 | Temp 97.6°F | Wt 224.0 lb

## 2017-07-04 DIAGNOSIS — I10 Essential (primary) hypertension: Secondary | ICD-10-CM | POA: Diagnosis not present

## 2017-07-04 MED ORDER — AMLODIPINE BESYLATE 5 MG PO TABS: 5.0000 mg | ORAL_TABLET | Freq: Every day | ORAL | 3 refills | Status: DC

## 2017-07-04 NOTE — Assessment & Plan Note (Signed)
Hypertension well controlled but with side effects will discontinue hydrochlorothiazide and start amlodipine 5 mgand observe blood pressure recheck in 1-2 months.

## 2017-07-04 NOTE — Progress Notes (Signed)
   BP 105/72   Pulse 80   Temp 97.6 F (36.4 C) (Oral)   Wt 224 lb (101.6 kg)   SpO2 97%   BMI 32.07 kg/m    Subjective:    Patient ID: Harold [name], male    DOB: Oct 29, 1965, 51 y.o.   MRN: [id number]  HPI: Harold [name] is a 51 y.o. male  Chief Complaint  Patient presents with  . Hypertension  . Ear Pain    Right  patient follow-up here not completely well but moving that way significantly improved.. Blood pressure doing really well with hydrochlorothiazide but having some ED symptoms. No side effects otherwise and takes medications faithfully.  Relevant past medical, surgical, family and social history reviewed and updated as indicated. Interim medical history since our last visit reviewed. Allergies and medications reviewed and updated.  Review of Systems  Constitutional: Negative.   Respiratory: Negative.   Cardiovascular: Negative.     Per HPI unless specifically indicated above     Objective:    BP 105/72   Pulse 80   Temp 97.6 F (36.4 C) (Oral)   Wt 224 lb (101.6 kg)   SpO2 97%   BMI 32.07 kg/m   Wt Readings from Last 3 Encounters:  07/04/17 224 lb (101.6 kg)  06/25/17 222 lb (100.7 kg)  03/19/17 220 lb (99.8 kg)    Physical Exam  Constitutional: He is oriented to person, place, and time. He appears well-developed and well-nourished.  HENT:  Head: Normocephalic and atraumatic.  Right Ear: External ear normal.  Left Ear: External ear normal.  Eyes: Conjunctivae and EOM are normal.  Neck: Normal range of motion.  Cardiovascular: Normal rate, regular rhythm and normal heart sounds.  Pulmonary/Chest: Effort normal and breath sounds normal.  Musculoskeletal: Normal range of motion.  Neurological: He is alert and oriented to person, place, and time.  Skin: No erythema.  Psychiatric: He has a normal mood and affect. His behavior is normal. Judgment and thought content normal.    Results for orders placed or performed in visit on  03/19/17  Basic metabolic panel  Result Value Ref Range   Glucose 95 65 - 99 mg/dL   BUN 18 6 - 24 mg/dL   Creatinine, Ser 7.821.12 0.76 - 1.27 mg/dL   GFR calc non Af Amer 77 >59 mL/min/1.73   GFR calc Af Amer 89 >59 mL/min/1.73   BUN/Creatinine Ratio 16 9 - 20   Sodium 141 134 - 144 mmol/L   Potassium 3.8 3.5 - 5.2 mmol/L   Chloride 101 96 - 106 mmol/L   CO2 24 20 - 29 mmol/L   Calcium 9.6 8.7 - 10.2 mg/dL      Assessment & Plan:   Problem List Items Addressed This Visit      Cardiovascular and Mediastinum   Essential hypertension - Primary    Hypertension well controlled but with side effects will discontinue hydrochlorothiazide and start amlodipine 5 mgand observe blood pressure recheck in 1-2 months.      Relevant Medications   amLODipine (NORVASC) 5 MG tablet   Other Relevant Orders   Basic metabolic panel     otitis media clearing will continue  antibiotics  And observe.  Follow up plan: Return in about 2 months (around 09/03/2017), or if symptoms worsen or fail to improve, for Recheck hypertension medication.

## 2017-08-21 ENCOUNTER — Ambulatory Visit: Payer: 59 | Admitting: Family Medicine

## 2017-08-27 DIAGNOSIS — L400 Psoriasis vulgaris: Secondary | ICD-10-CM | POA: Diagnosis not present

## 2017-08-28 ENCOUNTER — Ambulatory Visit (INDEPENDENT_AMBULATORY_CARE_PROVIDER_SITE_OTHER): Payer: 59 | Admitting: Family Medicine

## 2017-08-28 ENCOUNTER — Encounter: Payer: Self-pay | Admitting: Family Medicine

## 2017-08-28 DIAGNOSIS — K529 Noninfective gastroenteritis and colitis, unspecified: Secondary | ICD-10-CM | POA: Diagnosis not present

## 2017-08-28 DIAGNOSIS — I10 Essential (primary) hypertension: Secondary | ICD-10-CM | POA: Diagnosis not present

## 2017-08-28 MED ORDER — METOPROLOL SUCCINATE ER 25 MG PO TB24: 25.0000 mg | ORAL_TABLET | Freq: Every day | ORAL | 1 refills | Status: DC

## 2017-08-28 NOTE — Assessment & Plan Note (Signed)
Discuss hypertnsion care and treatment and trial of other medications. Will try metoprolol 25 mg May increase to 50 if no response.May need to go back to hydrochlorothiazide using low-dose 12.5 mg in combination with other medicatio including amlodipine.

## 2017-08-28 NOTE — Assessment & Plan Note (Signed)
Reviewed and stable.

## 2017-08-28 NOTE — Progress Notes (Signed)
   BP 113/79   Pulse 68   Wt 224 lb (101.6 kg)   SpO2 96%   BMI 32.07 kg/m    Subjective:    Patient ID: Harold [name], male    DOB: Aug 01, 1966, 52 y.o.   MRN: [id number]  HPI: Harold HeaterOssie O Yard [name] is a 52 y.o. male  Chief Complaint  Patient presents with  . Hypertension    Pt stopped amlodipine, and restarted HCTZ   Discuss hypertension with patient hydrochlorothiazide works very effectively for lowering blood pressure but unfortunately has side effect of her rectal dysfunction. Has tried amlodipine with really no effect Takes Benzapril with fair effect but not enough. Relevant past medical, surgical, family and social history reviewed and updated as indicated. Interim medical history since our last visit reviewed. Allergies and medications reviewed and updated.  Review of Systems  Constitutional: Negative.   Respiratory: Negative.   Cardiovascular: Negative.     Per HPI unless specifically indicated above     Objective:    BP 113/79   Pulse 68   Wt 224 lb (101.6 kg)   SpO2 96%   BMI 32.07 kg/m   Wt Readings from Last 3 Encounters:  08/28/17 224 lb (101.6 kg)  07/04/17 224 lb (101.6 kg)  06/25/17 222 lb (100.7 kg)    Physical Exam  Constitutional: He is oriented to person, place, and time. He appears well-developed and well-nourished.  HENT:  Head: Normocephalic and atraumatic.  Eyes: Conjunctivae and EOM are normal.  Neck: Normal range of motion.  Cardiovascular: Normal rate, regular rhythm and normal heart sounds.  Pulmonary/Chest: Effort normal and breath sounds normal.  Musculoskeletal: Normal range of motion.  Neurological: He is alert and oriented to person, place, and time.  Skin: No erythema.  Psychiatric: He has a normal mood and affect. His behavior is normal. Judgment and thought content normal.    Results for orders placed or performed in visit on 03/19/17  Basic metabolic panel  Result Value Ref Range   Glucose 95 65 - 99 mg/dL   BUN 18 6 - 24 mg/dL   Creatinine, Ser 9.141.12 0.76 - 1.27 mg/dL   GFR calc non Af Amer 77 >59 mL/min/1.73   GFR calc Af Amer 89 >59 mL/min/1.73   BUN/Creatinine Ratio 16 9 - 20   Sodium 141 134 - 144 mmol/L   Potassium 3.8 3.5 - 5.2 mmol/L   Chloride 101 96 - 106 mmol/L   CO2 24 20 - 29 mmol/L   Calcium 9.6 8.7 - 10.2 mg/dL      Assessment & Plan:   Problem List Items Addressed This Visit      Cardiovascular and Mediastinum   Essential hypertension    Discuss hypertnsion care and treatment and trial of other medications. Will try metoprolol 25 mg May increase to 50 if no response.May need to go back to hydrochlorothiazide using low-dose 12.5 mg in combination with other medicatio including amlodipine.      Relevant Medications   metoprolol succinate (TOPROL-XL) 25 MG 24 hr tablet     Digestive   Chronic diarrhea    Reviewed and stable          Follow up plan: Return in about 2 months (around 10/26/2017) for BMP.

## 2017-11-06 ENCOUNTER — Encounter: Payer: Self-pay | Admitting: Family Medicine

## 2017-11-06 ENCOUNTER — Ambulatory Visit (INDEPENDENT_AMBULATORY_CARE_PROVIDER_SITE_OTHER): Payer: 59 | Admitting: Family Medicine

## 2017-11-06 VITALS — BP 130/85 | HR 54 | Wt 220.0 lb

## 2017-11-06 DIAGNOSIS — I10 Essential (primary) hypertension: Secondary | ICD-10-CM

## 2017-11-06 MED ORDER — METOPROLOL SUCCINATE ER 50 MG PO TB24: 50.0000 mg | ORAL_TABLET | Freq: Every day | ORAL | 1 refills | Status: DC

## 2017-11-06 NOTE — Assessment & Plan Note (Signed)
The current medical regimen is effective;  continue present plan and medications.  

## 2017-11-06 NOTE — Progress Notes (Signed)
   BP 130/85   Pulse (!) 54   Wt 220 lb (99.8 kg)   SpO2 98%   BMI 31.50 kg/m    Subjective:    Patient ID: Harold [name], male    DOB: [birth year], 52 y.o.   MRN: [id number]  HPI: Harold [name] is a 52 y.o. male  Chief Complaint  Patient presents with  . Follow-up    Pt stated BP is under "okay" control  . Hypertension  Pt is all in all doing well taking metoprolol has increased from 25 mg to 50 mg Blood pressure has responded nicely with good blood pressure control and no side effects.  Continues to take benazepril also.  No longer taking amlodipine or hydrochlorothiazide. Pt had URI and still coughing some but all in all is doing much better Has Flonase but has not needed.  Relevant past medical, surgical, family and social history reviewed and updated as indicated. Interim medical history since our last visit reviewed. Allergies and medications reviewed and updated.  Review of Systems  Constitutional: Negative.   Respiratory: Negative.   Cardiovascular: Negative.     Per HPI unless specifically indicated above     Objective:    BP 130/85   Pulse (!) 54   Wt 220 lb (99.8 kg)   SpO2 98%   BMI 31.50 kg/m   Wt Readings from Last 3 Encounters:  11/06/17 220 lb (99.8 kg)  08/28/17 224 lb (101.6 kg)  07/04/17 224 lb (101.6 kg)    Physical Exam  Constitutional: He is oriented to person, place, and time. He appears well-developed and well-nourished.  HENT:  Head: Normocephalic and atraumatic.  Eyes: Conjunctivae and EOM are normal.  Neck: Normal range of motion.  Cardiovascular: Normal rate, regular rhythm and normal heart sounds.  Pulmonary/Chest: Effort normal and breath sounds normal.  Musculoskeletal: Normal range of motion.  Neurological: He is alert and oriented to person, place, and time.  Skin: No erythema.  Psychiatric: He has a normal mood and affect. His behavior is normal. Judgment and thought content normal.    Results for orders  placed or performed in visit on 03/19/17  Basic metabolic panel  Result Value Ref Range   Glucose 95 65 - 99 mg/dL   BUN 18 6 - 24 mg/dL   Creatinine, Ser 2.841.12 0.76 - 1.27 mg/dL   GFR calc non Af Amer 77 >59 mL/min/1.73   GFR calc Af Amer 89 >59 mL/min/1.73   BUN/Creatinine Ratio 16 9 - 20   Sodium 141 134 - 144 mmol/L   Potassium 3.8 3.5 - 5.2 mmol/L   Chloride 101 96 - 106 mmol/L   CO2 24 20 - 29 mmol/L   Calcium 9.6 8.7 - 10.2 mg/dL      Assessment & Plan:   Problem List Items Addressed This Visit      Cardiovascular and Mediastinum   Essential hypertension - Primary    The current medical regimen is effective;  continue present plan and medications.       Relevant Medications   metoprolol succinate (TOPROL-XL) 50 MG 24 hr tablet   Other Relevant Orders   Basic metabolic panel     discussed diarrhea and doing well with diet changes  Follow up plan: Return in about 3 months (around 02/06/2018) for Physical Exam.

## 2017-11-07 ENCOUNTER — Encounter: Payer: Self-pay | Admitting: Family Medicine

## 2017-11-07 LAB — BASIC METABOLIC PANEL
BUN/Creatinine Ratio: 11 (ref 9–20)
BUN: 14 mg/dL (ref 6–24)
CALCIUM: 9.2 mg/dL (ref 8.7–10.2)
CO2: 22 mmol/L (ref 20–29)
Chloride: 105 mmol/L (ref 96–106)
Creatinine, Ser: 1.27 mg/dL (ref 0.76–1.27)
GFR calc Af Amer: 76 mL/min/{1.73_m2} (ref >59)
GFR calc non Af Amer: 66 mL/min/{1.73_m2} (ref >59)
GLUCOSE: 109 mg/dL — AB (ref 65–99)
POTASSIUM: 4.2 mmol/L (ref 3.5–5.2)
SODIUM: 144 mmol/L (ref 134–144)

## 2018-01-22 ENCOUNTER — Ambulatory Visit (INDEPENDENT_AMBULATORY_CARE_PROVIDER_SITE_OTHER): Payer: 59 | Admitting: Family Medicine

## 2018-01-22 ENCOUNTER — Encounter: Payer: Self-pay | Admitting: Family Medicine

## 2018-01-22 VITALS — BP 137/91 | HR 61 | Ht 69.5 in | Wt 213.0 lb

## 2018-01-22 DIAGNOSIS — R7303 Prediabetes: Secondary | ICD-10-CM

## 2018-01-22 DIAGNOSIS — Z125 Encounter for screening for malignant neoplasm of prostate: Secondary | ICD-10-CM

## 2018-01-22 DIAGNOSIS — R7309 Other abnormal glucose: Secondary | ICD-10-CM | POA: Diagnosis not present

## 2018-01-22 DIAGNOSIS — K529 Noninfective gastroenteritis and colitis, unspecified: Secondary | ICD-10-CM | POA: Diagnosis not present

## 2018-01-22 DIAGNOSIS — Z1329 Encounter for screening for other suspected endocrine disorder: Secondary | ICD-10-CM | POA: Diagnosis not present

## 2018-01-22 DIAGNOSIS — I10 Essential (primary) hypertension: Secondary | ICD-10-CM | POA: Diagnosis not present

## 2018-01-22 DIAGNOSIS — H9193 Unspecified hearing loss, bilateral: Secondary | ICD-10-CM

## 2018-01-22 DIAGNOSIS — E559 Vitamin D deficiency, unspecified: Secondary | ICD-10-CM

## 2018-01-22 DIAGNOSIS — Z0000 Encounter for general adult medical examination without abnormal findings: Secondary | ICD-10-CM | POA: Diagnosis not present

## 2018-01-22 DIAGNOSIS — H9190 Unspecified hearing loss, unspecified ear: Secondary | ICD-10-CM | POA: Insufficient documentation

## 2018-01-22 LAB — URINALYSIS, ROUTINE W REFLEX MICROSCOPIC
BILIRUBIN UA: NEGATIVE
Glucose, UA: NEGATIVE
Leukocytes, UA: NEGATIVE
Nitrite, UA: NEGATIVE
PH UA: 6 (ref 5.0–7.5)
PROTEIN UA: NEGATIVE
RBC, UA: NEGATIVE
Specific Gravity, UA: 1.025 (ref 1.005–1.030)
Urobilinogen, Ur: 1 mg/dL (ref 0.2–1.0)

## 2018-01-22 MED ORDER — AMLODIPINE BESYLATE 5 MG PO TABS: 5.0000 mg | ORAL_TABLET | Freq: Every day | ORAL | 2 refills | Status: DC

## 2018-01-22 MED ORDER — METOPROLOL SUCCINATE ER 50 MG PO TB24: 50.0000 mg | ORAL_TABLET | Freq: Every day | ORAL | 4 refills | Status: DC

## 2018-01-22 MED ORDER — BENAZEPRIL HCL 40 MG PO TABS: 40.0000 mg | ORAL_TABLET | Freq: Every day | ORAL | 4 refills | Status: DC

## 2018-01-22 NOTE — Progress Notes (Signed)
BP (!) 137/91   Pulse 61   Ht 5' 9.5" (1.765 m)   Wt 213 lb (96.6 kg)   SpO2 98%   BMI 31.00 kg/m    Subjective:    Patient ID: Harold [name], male    DOB: 1965-12-05, 52 y.o.   MRN: [id number]  HPI: Harold [name] is a 52 y.o. male  Annual ExAam  Patient follow-up hypertension reviewed hypertension notes for the last year still having marked elevated readings mostly in the 140s over the low 100s.  Having occasional lower readings.  Also on review patient has lower readings here in the office.  No low blood pressure signs or symptoms.  Tolerating well metoprolol 50 and and Benzapril 40 mg. HCT had some symptoms of ADD.  Has taken diltiazem in the past. Has taken amlodipine in the past and as patient recalls did not do much but had no side effect. Tonic intermittent diarrhea is better with dietary intervention. Patient also notes via wife that hearing is less will need a hearing test. Relevant past medical, surgical, family and social history reviewed and updated as indicated. Interim medical history since our last visit reviewed. Allergies and medications reviewed and updated.  Review of Systems  Constitutional: Negative.   HENT: Negative.   Eyes: Negative.   Respiratory: Negative.   Cardiovascular: Negative.   Gastrointestinal: Negative.   Endocrine: Negative.   Genitourinary: Negative.   Musculoskeletal: Negative.   Skin: Negative.   Allergic/Immunologic: Negative.   Neurological: Negative.   Hematological: Negative.   Psychiatric/Behavioral: Negative.     Per HPI unless specifically indicated above     Objective:    BP (!) 137/91   Pulse 61   Ht 5' 9.5" (1.765 m)   Wt 213 lb (96.6 kg)   SpO2 98%   BMI 31.00 kg/m   Wt Readings from Last 3 Encounters:  01/22/18 213 lb (96.6 kg)  11/06/17 220 lb (99.8 kg)  08/28/17 224 lb (101.6 kg)    Physical Exam  Constitutional: He is oriented to person, place, and time. He appears well-developed and  well-nourished.  HENT:  Head: Normocephalic and atraumatic.  Right Ear: External ear normal.  Left Ear: External ear normal.  Eyes: Pupils are equal, round, and reactive to light. Conjunctivae and EOM are normal.  Neck: Normal range of motion. Neck supple.  Cardiovascular: Normal rate, regular rhythm, normal heart sounds and intact distal pulses.  Pulmonary/Chest: Effort normal and breath sounds normal.  Abdominal: Soft. Bowel sounds are normal. There is no splenomegaly or hepatomegaly.  Genitourinary: Rectum normal, prostate normal and penis normal.  Musculoskeletal: Normal range of motion.  Neurological: He is alert and oriented to person, place, and time. He has normal reflexes.  Skin: No rash noted. No erythema.  Psychiatric: He has a normal mood and affect. His behavior is normal. Judgment and thought content normal.    Results for orders placed or performed in visit on 11/06/17  Basic metabolic panel  Result Value Ref Range   Glucose 109 (H) 65 - 99 mg/dL   BUN 14 6 - 24 mg/dL   Creatinine, Ser 4.09 0.76 - 1.27 mg/dL   GFR calc non Af Amer 66 >59 mL/min/1.73   GFR calc Af Amer 76 >59 mL/min/1.73   BUN/Creatinine Ratio 11 9 - 20   Sodium 144 134 - 144 mmol/L   Potassium 4.2 3.5 - 5.2 mmol/L   Chloride 105 96 - 106 mmol/L   CO2 22 20 -  29 mmol/L   Calcium 9.2 8.7 - 10.2 mg/dL      Assessment & Plan:   Problem List Items Addressed This Visit      Cardiovascular and Mediastinum   Essential hypertension - Primary    Hypertension poor control will add amlodipine 5 mg recheck in 1 month or so to assess blood pressure control.      Relevant Medications   benazepril (LOTENSIN) 40 MG tablet   metoprolol succinate (TOPROL-XL) 50 MG 24 hr tablet   amLODipine (NORVASC) 5 MG tablet   Other Relevant Orders   CBC with Differential/Platelet   Comprehensive metabolic panel   Lipid panel   Urinalysis, Routine w reflex microscopic     Digestive   Chronic diarrhea    The  current medical regimen is effective;  continue present plan and medications.         Other   Vitamin D deficiency   Relevant Orders   CBC with Differential/Platelet   Comprehensive metabolic panel   Pre-diabetes   Relevant Orders   CBC with Differential/Platelet   Comprehensive metabolic panel   Lipid panel   Urinalysis, Routine w reflex microscopic   Decreased hearing    Will refer for hearing tests      Relevant Orders   Ambulatory referral to Audiology    Other Visit Diagnoses    Elevated glucose       Relevant Orders   CBC with Differential/Platelet   Comprehensive metabolic panel   Urinalysis, Routine w reflex microscopic   Annual physical exam       Relevant Orders   CBC with Differential/Platelet   Comprehensive metabolic panel   Lipid panel   PSA   TSH   Urinalysis, Routine w reflex microscopic   Thyroid disorder screen       Relevant Orders   TSH   Prostate cancer screening       Relevant Orders   PSA       Follow up plan: Return in about 1 month (around 02/19/2018) for BMP.

## 2018-01-22 NOTE — Assessment & Plan Note (Signed)
Hypertension poor control will add amlodipine 5 mg recheck in 1 month or so to assess blood pressure control.

## 2018-01-22 NOTE — Assessment & Plan Note (Signed)
Will refer for hearing tests

## 2018-01-22 NOTE — Assessment & Plan Note (Signed)
The current medical regimen is effective;  continue present plan and medications.  

## 2018-01-23 LAB — LIPID PANEL
CHOL/HDL RATIO: 3.8 ratio (ref 0.0–5.0)
Cholesterol, Total: 193 mg/dL (ref 100–199)
HDL: 51 mg/dL (ref >39)
LDL CALC: 106 mg/dL — AB (ref 0–99)
Triglycerides: 181 mg/dL — ABNORMAL HIGH (ref 0–149)
VLDL CHOLESTEROL CAL: 36 mg/dL (ref 5–40)

## 2018-01-23 LAB — CBC WITH DIFFERENTIAL/PLATELET
BASOS: 0 %
Basophils Absolute: 0 10*3/uL (ref 0.0–0.2)
EOS (ABSOLUTE): 0 10*3/uL (ref 0.0–0.4)
EOS: 1 %
Hematocrit: 41.6 % (ref 37.5–51.0)
Hemoglobin: 14.1 g/dL (ref 13.0–17.7)
IMMATURE GRANS (ABS): 0 10*3/uL (ref 0.0–0.1)
IMMATURE GRANULOCYTES: 0 %
LYMPHS: 28 %
Lymphocytes Absolute: 1.7 10*3/uL (ref 0.7–3.1)
MCH: 29.8 pg (ref 26.6–33.0)
MCHC: 33.9 g/dL (ref 31.5–35.7)
MCV: 88 fL (ref 79–97)
Monocytes Absolute: 0.4 10*3/uL (ref 0.1–0.9)
Monocytes: 6 %
NEUTROS PCT: 65 %
Neutrophils Absolute: 3.9 10*3/uL (ref 1.4–7.0)
PLATELETS: 215 10*3/uL (ref 150–450)
RBC: 4.73 x10E6/uL (ref 4.14–5.80)
RDW: 14.1 % (ref 12.3–15.4)
WBC: 6.1 10*3/uL (ref 3.4–10.8)

## 2018-01-23 LAB — COMPREHENSIVE METABOLIC PANEL
A/G RATIO: 1.8 (ref 1.2–2.2)
ALT: 20 IU/L (ref 0–44)
AST: 18 IU/L (ref 0–40)
Albumin: 4.2 g/dL (ref 3.5–5.5)
Alkaline Phosphatase: 55 IU/L (ref 39–117)
BUN/Creatinine Ratio: 13 (ref 9–20)
BUN: 14 mg/dL (ref 6–24)
Bilirubin Total: 1 mg/dL (ref 0.0–1.2)
CALCIUM: 9.1 mg/dL (ref 8.7–10.2)
CO2: 22 mmol/L (ref 20–29)
CREATININE: 1.12 mg/dL (ref 0.76–1.27)
Chloride: 104 mmol/L (ref 96–106)
GFR, EST AFRICAN AMERICAN: 89 mL/min/{1.73_m2} (ref >59)
GFR, EST NON AFRICAN AMERICAN: 77 mL/min/{1.73_m2} (ref >59)
Globulin, Total: 2.3 g/dL (ref 1.5–4.5)
Glucose: 108 mg/dL — ABNORMAL HIGH (ref 65–99)
POTASSIUM: 4 mmol/L (ref 3.5–5.2)
Sodium: 141 mmol/L (ref 134–144)
TOTAL PROTEIN: 6.5 g/dL (ref 6.0–8.5)

## 2018-01-23 LAB — TSH: TSH: 0.747 u[IU]/mL (ref 0.450–4.500)

## 2018-01-23 LAB — PSA: PROSTATE SPECIFIC AG, SERUM: 0.6 ng/mL (ref 0.0–4.0)

## 2018-01-26 ENCOUNTER — Encounter: Payer: Self-pay | Admitting: Family Medicine

## 2018-02-06 DIAGNOSIS — H93299 Other abnormal auditory perceptions, unspecified ear: Secondary | ICD-10-CM | POA: Diagnosis not present

## 2018-02-26 ENCOUNTER — Ambulatory Visit (INDEPENDENT_AMBULATORY_CARE_PROVIDER_SITE_OTHER): Payer: 59 | Admitting: Family Medicine

## 2018-02-26 ENCOUNTER — Encounter: Payer: Self-pay | Admitting: Family Medicine

## 2018-02-26 VITALS — BP 148/98 | HR 72 | Ht 70.0 in | Wt 216.7 lb

## 2018-02-26 DIAGNOSIS — R7303 Prediabetes: Secondary | ICD-10-CM

## 2018-02-26 DIAGNOSIS — I10 Essential (primary) hypertension: Secondary | ICD-10-CM | POA: Diagnosis not present

## 2018-02-26 MED ORDER — AMLODIPINE BESYLATE 10 MG PO TABS: 10.0000 mg | ORAL_TABLET | Freq: Every day | ORAL | 3 refills | Status: DC

## 2018-02-26 NOTE — Progress Notes (Signed)
BP (!) 148/98 (BP Location: Left Arm)   Pulse 72   Ht 5\' 10"  (1.778 m)   Wt 216 lb 11.2 oz (98.3 kg)   SpO2 99%   BMI 31.09 kg/m    Subjective:    Patient ID: Harold [name], male    DOB: 1966/03/21, 52 y.o.   MRN: [id number]  HPI: Harold [name] is a 52 y.o. male  Chief Complaint  Patient presents with  . Follow-up  . Hypertension  Patient follow-up hypertension doing well no complaints from medications taking medicines without problems or issues. Patient has not taken medicine yet today usually takes in the morning but on home readings does have elevated readings especially in the evenings. Otherwise taking medicines without problems or issues. Patient also this last weekend had some right TMJ area discomfort no known specific trauma chewing gum big bites or other type irritation.  Comes spontaneously resolved has not had any other tooth dental or ear symptoms.  Relevant past medical, surgical, family and social history reviewed and updated as indicated. Interim medical history since our last visit reviewed. Allergies and medications reviewed and updated.  Review of Systems  Constitutional: Negative.   Respiratory: Negative.   Cardiovascular: Negative.     Per HPI unless specifically indicated above     Objective:    BP (!) 148/98 (BP Location: Left Arm)   Pulse 72   Ht 5\' 10"  (1.778 m)   Wt 216 lb 11.2 oz (98.3 kg)   SpO2 99%   BMI 31.09 kg/m   Wt Readings from Last 3 Encounters:  02/26/18 216 lb 11.2 oz (98.3 kg)  01/22/18 213 lb (96.6 kg)  11/06/17 220 lb (99.8 kg)    Physical Exam  Constitutional: He is oriented to person, place, and time. He appears well-developed and well-nourished.  HENT:  Head: Normocephalic and atraumatic.  Right Ear: External ear normal.  Left Ear: External ear normal.  Nose: Nose normal.  Mouth/Throat: Oropharynx is clear and moist.  Eyes: Conjunctivae and EOM are normal.  Neck: Normal range of motion.    Cardiovascular: Normal rate, regular rhythm and normal heart sounds.  Pulmonary/Chest: Effort normal and breath sounds normal.  Musculoskeletal: Normal range of motion.  Neurological: He is alert and oriented to person, place, and time.  Skin: No erythema.  Psychiatric: He has a normal mood and affect. His behavior is normal. Judgment and thought content normal.    Results for orders placed or performed in visit on 01/22/18  CBC with Differential/Platelet  Result Value Ref Range   WBC 6.1 3.4 - 10.8 x10E3/uL   RBC 4.73 4.14 - 5.80 x10E6/uL   Hemoglobin 14.1 13.0 - 17.7 g/dL   Hematocrit 40.941.6 81.137.5 - 51.0 %   MCV 88 79 - 97 fL   MCH 29.8 26.6 - 33.0 pg   MCHC 33.9 31.5 - 35.7 g/dL   RDW 91.414.1 78.212.3 - 95.615.4 %   Platelets 215 150 - 450 x10E3/uL   Neutrophils 65 Not Estab. %   Lymphs 28 Not Estab. %   Monocytes 6 Not Estab. %   Eos 1 Not Estab. %   Basos 0 Not Estab. %   Neutrophils Absolute 3.9 1.4 - 7.0 x10E3/uL   Lymphocytes Absolute 1.7 0.7 - 3.1 x10E3/uL   Monocytes Absolute 0.4 0.1 - 0.9 x10E3/uL   EOS (ABSOLUTE) 0.0 0.0 - 0.4 x10E3/uL   Basophils Absolute 0.0 0.0 - 0.2 x10E3/uL   Immature Granulocytes 0 Not Estab. %  Immature Grans (Abs) 0.0 0.0 - 0.1 x10E3/uL  Comprehensive metabolic panel  Result Value Ref Range   Glucose 108 (H) 65 - 99 mg/dL   BUN 14 6 - 24 mg/dL   Creatinine, Ser 4.33 0.76 - 1.27 mg/dL   GFR calc non Af Amer 77 >59 mL/min/1.73   GFR calc Af Amer 89 >59 mL/min/1.73   BUN/Creatinine Ratio 13 9 - 20   Sodium 141 134 - 144 mmol/L   Potassium 4.0 3.5 - 5.2 mmol/L   Chloride 104 96 - 106 mmol/L   CO2 22 20 - 29 mmol/L   Calcium 9.1 8.7 - 10.2 mg/dL   Total Protein 6.5 6.0 - 8.5 g/dL   Albumin 4.2 3.5 - 5.5 g/dL   Globulin, Total 2.3 1.5 - 4.5 g/dL   Albumin/Globulin Ratio 1.8 1.2 - 2.2   Bilirubin Total 1.0 0.0 - 1.2 mg/dL   Alkaline Phosphatase 55 39 - 117 IU/L   AST 18 0 - 40 IU/L   ALT 20 0 - 44 IU/L  Lipid panel  Result Value Ref Range    Cholesterol, Total 193 100 - 199 mg/dL   Triglycerides 295 (H) 0 - 149 mg/dL   HDL 51 >18 mg/dL   VLDL Cholesterol Cal 36 5 - 40 mg/dL   LDL Calculated 841 (H) 0 - 99 mg/dL   Chol/HDL Ratio 3.8 0.0 - 5.0 ratio  PSA  Result Value Ref Range   Prostate Specific Ag, Serum 0.6 0.0 - 4.0 ng/mL  TSH  Result Value Ref Range   TSH 0.747 0.450 - 4.500 uIU/mL  Urinalysis, Routine w reflex microscopic  Result Value Ref Range   Specific Gravity, UA 1.025 1.005 - 1.030   pH, UA 6.0 5.0 - 7.5   Color, UA Orange Yellow   Appearance Ur Clear Clear   Leukocytes, UA Negative Negative   Protein, UA Negative Negative/Trace   Glucose, UA Negative Negative   Ketones, UA Trace (A) Negative   RBC, UA Negative Negative   Bilirubin, UA Negative Negative   Urobilinogen, Ur 1.0 0.2 - 1.0 mg/dL   Nitrite, UA Negative Negative      Assessment & Plan:   Problem List Items Addressed This Visit      Cardiovascular and Mediastinum   Essential hypertension - Primary    Discussed hypertension poor control will increase amlodipine from 5 mg to 10 mg discussed possibility of increased ankle edema.      Relevant Medications   amLODipine (NORVASC) 10 MG tablet   Other Relevant Orders   Basic metabolic panel     Other   Pre-diabetes   Relevant Orders   Basic metabolic panel    Discussed TMJ area patient will protect and use Advil Aleve if needed.  Follow up plan: Return in about 1 month (around 03/26/2018).  Checking blood pressure to assess control.

## 2018-02-26 NOTE — Assessment & Plan Note (Signed)
Discussed hypertension poor control will increase amlodipine from 5 mg to 10 mg discussed possibility of increased ankle edema.

## 2018-03-30 ENCOUNTER — Encounter: Payer: Self-pay | Admitting: Family Medicine

## 2018-03-30 ENCOUNTER — Ambulatory Visit (INDEPENDENT_AMBULATORY_CARE_PROVIDER_SITE_OTHER): Payer: 59 | Admitting: Family Medicine

## 2018-03-30 DIAGNOSIS — I10 Essential (primary) hypertension: Secondary | ICD-10-CM

## 2018-03-30 MED ORDER — AMLODIPINE BESYLATE 10 MG PO TABS: 10.0000 mg | ORAL_TABLET | Freq: Every day | ORAL | 3 refills | Status: DC

## 2018-03-30 NOTE — Progress Notes (Signed)
BP 111/74   Pulse 72   Wt 213 lb (96.6 kg)   SpO2 95%   BMI 30.56 kg/m    Subjective:    Patient ID: Harold [name], male    DOB: Oct 27, 1965, 52 y.o.   MRN: [id number]  HPI: Harold HeaterOssie O Collings [name] is a 52 y.o. male  Chief Complaint  Patient presents with  . Follow-up  . Hypertension  Recheck hypertension patient doing well no complaints from medication no ankle or leg edema has been noticed.  Home blood pressure monitoring indicating good control and similar to results here today.  Relevant past medical, surgical, family and social history reviewed and updated as indicated. Interim medical history since our last visit reviewed. Allergies and medications reviewed and updated.  Review of Systems  Constitutional: Negative.   Respiratory: Negative.   Cardiovascular: Negative.     Per HPI unless specifically indicated above     Objective:    BP 111/74   Pulse 72   Wt 213 lb (96.6 kg)   SpO2 95%   BMI 30.56 kg/m   Wt Readings from Last 3 Encounters:  03/30/18 213 lb (96.6 kg)  02/26/18 216 lb 11.2 oz (98.3 kg)  01/22/18 213 lb (96.6 kg)    Physical Exam  Constitutional: He is oriented to person, place, and time. He appears well-developed and well-nourished.  HENT:  Head: Normocephalic and atraumatic.  Eyes: Conjunctivae and EOM are normal.  Neck: Normal range of motion.  Cardiovascular: Normal rate, regular rhythm and normal heart sounds.  Pulmonary/Chest: Effort normal and breath sounds normal.  Musculoskeletal: Normal range of motion.  Neurological: He is alert and oriented to person, place, and time.  Skin: No erythema.  Psychiatric: He has a normal mood and affect. His behavior is normal. Judgment and thought content normal.    Results for orders placed or performed in visit on 01/22/18  CBC with Differential/Platelet  Result Value Ref Range   WBC 6.1 3.4 - 10.8 x10E3/uL   RBC 4.73 4.14 - 5.80 x10E6/uL   Hemoglobin 14.1 13.0 - 17.7 g/dL   Hematocrit 86.541.6 78.437.5 - 51.0 %   MCV 88 79 - 97 fL   MCH 29.8 26.6 - 33.0 pg   MCHC 33.9 31.5 - 35.7 g/dL   RDW 69.614.1 29.512.3 - 28.415.4 %   Platelets 215 150 - 450 x10E3/uL   Neutrophils 65 Not Estab. %   Lymphs 28 Not Estab. %   Monocytes 6 Not Estab. %   Eos 1 Not Estab. %   Basos 0 Not Estab. %   Neutrophils Absolute 3.9 1.4 - 7.0 x10E3/uL   Lymphocytes Absolute 1.7 0.7 - 3.1 x10E3/uL   Monocytes Absolute 0.4 0.1 - 0.9 x10E3/uL   EOS (ABSOLUTE) 0.0 0.0 - 0.4 x10E3/uL   Basophils Absolute 0.0 0.0 - 0.2 x10E3/uL   Immature Granulocytes 0 Not Estab. %   Immature Grans (Abs) 0.0 0.0 - 0.1 x10E3/uL  Comprehensive metabolic panel  Result Value Ref Range   Glucose 108 (H) 65 - 99 mg/dL   BUN 14 6 - 24 mg/dL   Creatinine, Ser 1.321.12 0.76 - 1.27 mg/dL   GFR calc non Af Amer 77 >59 mL/min/1.73   GFR calc Af Amer 89 >59 mL/min/1.73   BUN/Creatinine Ratio 13 9 - 20   Sodium 141 134 - 144 mmol/L   Potassium 4.0 3.5 - 5.2 mmol/L   Chloride 104 96 - 106 mmol/L   CO2 22 20 - 29 mmol/L  Calcium 9.1 8.7 - 10.2 mg/dL   Total Protein 6.5 6.0 - 8.5 g/dL   Albumin 4.2 3.5 - 5.5 g/dL   Globulin, Total 2.3 1.5 - 4.5 g/dL   Albumin/Globulin Ratio 1.8 1.2 - 2.2   Bilirubin Total 1.0 0.0 - 1.2 mg/dL   Alkaline Phosphatase 55 39 - 117 IU/L   AST 18 0 - 40 IU/L   ALT 20 0 - 44 IU/L  Lipid panel  Result Value Ref Range   Cholesterol, Total 193 100 - 199 mg/dL   Triglycerides 454181 (H) 0 - 149 mg/dL   HDL 51 >09>39 mg/dL   VLDL Cholesterol Cal 36 5 - 40 mg/dL   LDL Calculated 811106 (H) 0 - 99 mg/dL   Chol/HDL Ratio 3.8 0.0 - 5.0 ratio  PSA  Result Value Ref Range   Prostate Specific Ag, Serum 0.6 0.0 - 4.0 ng/mL  TSH  Result Value Ref Range   TSH 0.747 0.450 - 4.500 uIU/mL  Urinalysis, Routine w reflex microscopic  Result Value Ref Range   Specific Gravity, UA 1.025 1.005 - 1.030   pH, UA 6.0 5.0 - 7.5   Color, UA Orange Yellow   Appearance Ur Clear Clear   Leukocytes, UA Negative Negative   Protein,  UA Negative Negative/Trace   Glucose, UA Negative Negative   Ketones, UA Trace (A) Negative   RBC, UA Negative Negative   Bilirubin, UA Negative Negative   Urobilinogen, Ur 1.0 0.2 - 1.0 mg/dL   Nitrite, UA Negative Negative      Assessment & Plan:   Problem List Items Addressed This Visit      Cardiovascular and Mediastinum   Essential hypertension    The current medical regimen is effective;  continue present plan and medications.       Relevant Medications   amLODipine (NORVASC) 10 MG tablet       Follow up plan: Return for Physical Exam, June.

## 2018-03-30 NOTE — Assessment & Plan Note (Signed)
The current medical regimen is effective;  continue present plan and medications.  

## 2018-06-05 DIAGNOSIS — T23161A Burn of first degree of back of right hand, initial encounter: Secondary | ICD-10-CM | POA: Diagnosis not present

## 2018-08-27 DIAGNOSIS — L400 Psoriasis vulgaris: Secondary | ICD-10-CM | POA: Diagnosis not present

## 2019-01-27 ENCOUNTER — Other Ambulatory Visit: Payer: Self-pay | Admitting: Family Medicine

## 2019-01-27 DIAGNOSIS — I10 Essential (primary) hypertension: Secondary | ICD-10-CM

## 2019-02-03 ENCOUNTER — Encounter: Payer: 59 | Admitting: Family Medicine

## 2019-02-03 ENCOUNTER — Ambulatory Visit: Payer: Self-pay | Admitting: Family Medicine

## 2019-02-04 ENCOUNTER — Encounter: Payer: Self-pay | Admitting: Family Medicine

## 2019-02-04 ENCOUNTER — Other Ambulatory Visit: Payer: Self-pay

## 2019-02-04 ENCOUNTER — Ambulatory Visit (INDEPENDENT_AMBULATORY_CARE_PROVIDER_SITE_OTHER): Payer: 59 | Admitting: Family Medicine

## 2019-02-04 DIAGNOSIS — K529 Noninfective gastroenteritis and colitis, unspecified: Secondary | ICD-10-CM | POA: Diagnosis not present

## 2019-02-04 DIAGNOSIS — R7303 Prediabetes: Secondary | ICD-10-CM

## 2019-02-04 DIAGNOSIS — I10 Essential (primary) hypertension: Secondary | ICD-10-CM

## 2019-02-04 MED ORDER — METOPROLOL SUCCINATE ER 50 MG PO TB24: 50.0000 mg | ORAL_TABLET | Freq: Every day | ORAL | 4 refills | Status: DC

## 2019-02-04 MED ORDER — AMLODIPINE BESYLATE 10 MG PO TABS: 10.0000 mg | ORAL_TABLET | Freq: Every day | ORAL | 4 refills | Status: DC

## 2019-02-04 MED ORDER — BENAZEPRIL HCL 40 MG PO TABS: 40.0000 mg | ORAL_TABLET | Freq: Every day | ORAL | 4 refills | Status: DC

## 2019-02-04 NOTE — Assessment & Plan Note (Signed)
Unsuccessful at weight loss but still thinking about it and trying

## 2019-02-04 NOTE — Assessment & Plan Note (Signed)
Diarrhea intermittently does well with dietary restriction but otherwise stable.

## 2019-02-04 NOTE — Assessment & Plan Note (Signed)
The current medical regimen is effective;  continue present plan and medications.  

## 2019-02-04 NOTE — Progress Notes (Signed)
BP 124/73   Wt 211 lb (95.7 kg)   BMI 30.28 kg/m    Subjective:    Patient ID: Harold [name], male    DOB: [birth year], 53 y.o.   MRN: [id number]  HPI: Harold HeaterOssie O Mcmartin [name] is a 53 y.o. male  Med check Patient recheck doing well no complaints from blood pressure.  Takes blood pressure medications faithfully without problems good control with good home readings. Chronic diarrhea with intermittently comes on with certain foods which patient has identified. Still thinking about weight loss has been unable to lose weight.   Relevant past medical, surgical, family and social history reviewed and updated as indicated. Interim medical history since our last visit reviewed. Allergies and medications reviewed and updated.  Review of Systems  Constitutional: Negative.   HENT: Negative.   Eyes: Negative.   Respiratory: Negative.   Cardiovascular: Negative.   Gastrointestinal: Negative.   Endocrine: Negative.   Genitourinary: Negative.   Musculoskeletal: Negative.   Skin: Negative.   Allergic/Immunologic: Negative.   Neurological: Negative.   Hematological: Negative.   Psychiatric/Behavioral: Negative.     Per HPI unless specifically indicated above     Objective:    BP 124/73   Wt 211 lb (95.7 kg)   BMI 30.28 kg/m   Wt Readings from Last 3 Encounters:  02/04/19 211 lb (95.7 kg)  03/30/18 213 lb (96.6 kg)  02/26/18 216 lb 11.2 oz (98.3 kg)    Physical Exam  Results for orders placed or performed in visit on 01/22/18  CBC with Differential/Platelet  Result Value Ref Range   WBC 6.1 3.4 - 10.8 x10E3/uL   RBC 4.73 4.14 - 5.80 x10E6/uL   Hemoglobin 14.1 13.0 - 17.7 g/dL   Hematocrit 40.941.6 81.137.5 - 51.0 %   MCV 88 79 - 97 fL   MCH 29.8 26.6 - 33.0 pg   MCHC 33.9 31.5 - 35.7 g/dL   RDW 91.414.1 78.212.3 - 95.615.4 %   Platelets 215 150 - 450 x10E3/uL   Neutrophils 65 Not Estab. %   Lymphs 28 Not Estab. %   Monocytes 6 Not Estab. %   Eos 1 Not Estab. %   Basos 0 Not  Estab. %   Neutrophils Absolute 3.9 1.4 - 7.0 x10E3/uL   Lymphocytes Absolute 1.7 0.7 - 3.1 x10E3/uL   Monocytes Absolute 0.4 0.1 - 0.9 x10E3/uL   EOS (ABSOLUTE) 0.0 0.0 - 0.4 x10E3/uL   Basophils Absolute 0.0 0.0 - 0.2 x10E3/uL   Immature Granulocytes 0 Not Estab. %   Immature Grans (Abs) 0.0 0.0 - 0.1 x10E3/uL  Comprehensive metabolic panel  Result Value Ref Range   Glucose 108 (H) 65 - 99 mg/dL   BUN 14 6 - 24 mg/dL   Creatinine, Ser 2.131.12 0.76 - 1.27 mg/dL   GFR calc non Af Amer 77 >59 mL/min/1.73   GFR calc Af Amer 89 >59 mL/min/1.73   BUN/Creatinine Ratio 13 9 - 20   Sodium 141 134 - 144 mmol/L   Potassium 4.0 3.5 - 5.2 mmol/L   Chloride 104 96 - 106 mmol/L   CO2 22 20 - 29 mmol/L   Calcium 9.1 8.7 - 10.2 mg/dL   Total Protein 6.5 6.0 - 8.5 g/dL   Albumin 4.2 3.5 - 5.5 g/dL   Globulin, Total 2.3 1.5 - 4.5 g/dL   Albumin/Globulin Ratio 1.8 1.2 - 2.2   Bilirubin Total 1.0 0.0 - 1.2 mg/dL   Alkaline Phosphatase 55 39 - 117 IU/L  AST 18 0 - 40 IU/L   ALT 20 0 - 44 IU/L  Lipid panel  Result Value Ref Range   Cholesterol, Total 193 100 - 199 mg/dL   Triglycerides 181 (H) 0 - 149 mg/dL   HDL 51 >39 mg/dL   VLDL Cholesterol Cal 36 5 - 40 mg/dL   LDL Calculated 106 (H) 0 - 99 mg/dL   Chol/HDL Ratio 3.8 0.0 - 5.0 ratio  PSA  Result Value Ref Range   Prostate Specific Ag, Serum 0.6 0.0 - 4.0 ng/mL  TSH  Result Value Ref Range   TSH 0.747 0.450 - 4.500 uIU/mL  Urinalysis, Routine w reflex microscopic  Result Value Ref Range   Specific Gravity, UA 1.025 1.005 - 1.030   pH, UA 6.0 5.0 - 7.5   Color, UA Orange Yellow   Appearance Ur Clear Clear   Leukocytes, UA Negative Negative   Protein, UA Negative Negative/Trace   Glucose, UA Negative Negative   Ketones, UA Trace (A) Negative   RBC, UA Negative Negative   Bilirubin, UA Negative Negative   Urobilinogen, Ur 1.0 0.2 - 1.0 mg/dL   Nitrite, UA Negative Negative      Assessment & Plan:   Problem List Items Addressed  This Visit      Cardiovascular and Mediastinum   Essential hypertension    The current medical regimen is effective;  continue present plan and medications.         Digestive   Chronic diarrhea    Diarrhea intermittently does well with dietary restriction but otherwise stable.        Other   Pre-diabetes    Unsuccessful at weight loss but still thinking about it and trying          Follow up plan: Return in about 6 months (around 08/06/2019) for BMP.

## 2019-03-04 ENCOUNTER — Ambulatory Visit: Payer: Self-pay | Admitting: Family Medicine

## 2019-03-08 ENCOUNTER — Encounter: Payer: Self-pay | Admitting: Nurse Practitioner

## 2019-03-08 ENCOUNTER — Other Ambulatory Visit: Payer: Self-pay

## 2019-03-08 ENCOUNTER — Ambulatory Visit (INDEPENDENT_AMBULATORY_CARE_PROVIDER_SITE_OTHER): Payer: 59 | Admitting: Nurse Practitioner

## 2019-03-08 VITALS — BP 111/75 | HR 67 | Temp 98.6°F | Ht 69.0 in | Wt 219.0 lb

## 2019-03-08 DIAGNOSIS — R7303 Prediabetes: Secondary | ICD-10-CM | POA: Diagnosis not present

## 2019-03-08 DIAGNOSIS — Z0000 Encounter for general adult medical examination without abnormal findings: Secondary | ICD-10-CM

## 2019-03-08 DIAGNOSIS — I10 Essential (primary) hypertension: Secondary | ICD-10-CM

## 2019-03-08 DIAGNOSIS — E559 Vitamin D deficiency, unspecified: Secondary | ICD-10-CM

## 2019-03-08 NOTE — Assessment & Plan Note (Signed)
Chronic, stable with BP at goal in office and on home readings.  Continue current medication regimen and adjust as needed.  Labs today.  Return in 6 months.

## 2019-03-08 NOTE — Patient Instructions (Signed)

## 2019-03-08 NOTE — Assessment & Plan Note (Signed)
Check A1C today and adjust plan of care as needed.  Continue diet focus at this time.

## 2019-03-08 NOTE — Assessment & Plan Note (Signed)
Check Vit D level today.  Currently no supplements.

## 2019-03-08 NOTE — Progress Notes (Signed)
BP 111/75   Pulse 67   Temp 98.6 F (37 C) (Oral)   Ht 5\' 9"  (1.753 m)   Wt 219 lb (99.3 kg)   SpO2 95%   BMI 32.34 kg/m    Subjective:    Patient ID: Harold [name], male    DOB: [birth year], 53 y.o.   MRN: [id number]  HPI: Harold [name] is a 53 y.o. male presenting on 03/08/2019 for comprehensive medical examination. Current medical complaints include:none  He currently lives with: significant other Interim Problems from his last visit: no   HYPERTENSION Hypertension status: stable  Satisfied with current treatment? yes Duration of hypertension: chronic BP monitoring frequency:  a few times a week BP range: on average 121/71 at home BP medication side effects:  no Medication compliance: good compliance Aspirin: no Recurrent headaches: no Visual changes: no Palpitations: no Dyspnea: no Chest pain: no Lower extremity edema: no Dizzy/lightheaded: no   PREDIABETES: Recent labs noting glucose above 100.  Denies polyuria, polyphagia, polydipsia.    Functional Status Survey: Is the patient deaf or have difficulty hearing?: No Does the patient have difficulty seeing, even when wearing glasses/contacts?: No Does the patient have difficulty concentrating, remembering, or making decisions?: No Does the patient have difficulty walking or climbing stairs?: No Does the patient have difficulty dressing or bathing?: No Does the patient have difficulty doing errands alone such as visiting a doctor's office or shopping?: No  FALL RISK: Fall Risk  03/30/2018 02/26/2018 01/22/2018 11/06/2017 08/28/2017  Falls in the past year? No No No No No    Depression Screen [facility] 03/08/2019 01/22/2018 11/06/2017 08/28/2017 07/04/2017  Decreased Interest 0 0 0 0 0  Down, Depressed, Hopeless 0 0 0 0 0  PHQ - 2 Score 0 0 0 0 0  Altered sleeping 2 - 0 - -  Tired, decreased energy 2 - 0 - -  Change in appetite 1 - 0 - -  Feeling bad or failure about yourself  0 - 0 -  -  Trouble concentrating 0 - 0 - -  Moving slowly or fidgety/restless 0 - 0 - -  Suicidal thoughts 0 - 0 - -  PHQ-9 Score 5 - 0 - -  Difficult doing work/chores Not difficult at all - - - -    Advanced Directives <no information>  Past Medical History:  History reviewed. No pertinent past medical history.  Surgical History:  Past Surgical History:  Procedure Laterality Date  . wisdom teeth removed      Medications:  Current Outpatient Medications on File Prior to Visit  Medication Sig  . amLODipine (NORVASC) 10 MG tablet Take 1 tablet (10 mg total) by mouth daily.  . benazepril (LOTENSIN) 40 MG tablet Take 1 tablet (40 mg total) by mouth daily.  . metoprolol succinate (TOPROL-XL) 50 MG 24 hr tablet Take 1 tablet (50 mg total) by mouth daily. Take with or immediately following a meal.   No current facility-administered medications on file prior to visit.     Allergies:  Allergies  Allergen Reactions  . Hct [Hydrochlorothiazide] Other (See Comments)    ED    Social History:  Social History   Socioeconomic History  . Marital status: Married    Spouse name: Not on file  . Number of children: Not on file  . Years of education: Not on file  . Highest education level: Not on file  Occupational History  . Not on file  Social  Needs  . Financial resource strain: Not on file  . Food insecurity    Worry: Not on file    Inability: Not on file  . Transportation needs    Medical: Not on file    Non-medical: Not on file  Tobacco Use  . Smoking status: Never Smoker  . Smokeless tobacco: Never Used  Substance and Sexual Activity  . Alcohol use: No  . Drug use: No  . Sexual activity: Not on file  Lifestyle  . Physical activity    Days per week: Not on file    Minutes per session: Not on file  . Stress: Not on file  Relationships  . Social Herbalist on phone: Not on file    Gets together: Not on file    Attends religious service: Not on file    Active  member of club or organization: Not on file    Attends meetings of clubs or organizations: Not on file    Relationship status: Not on file  . Intimate partner violence    Fear of current or ex partner: Not on file    Emotionally abused: Not on file    Physically abused: Not on file    Forced sexual activity: Not on file  Other Topics Concern  . Not on file  Social History Narrative  . Not on file   Social History   Tobacco Use  Smoking Status Never Smoker  Smokeless Tobacco Never Used   Social History   Substance and Sexual Activity  Alcohol Use No    Family History:  Family History  Problem Relation Age of Onset  . Parkinson's disease Mother   . Hypertension Mother   . Alcohol abuse Father   . Hypertension Father     Past medical history, surgical history, medications, allergies, family history and social history reviewed with patient today and changes made to appropriate areas of the chart.   Review of Systems -  none All other ROS negative except what is listed above and in the HPI.      Objective:    BP 111/75   Pulse 67   Temp 98.6 F (37 C) (Oral)   Ht 5\' 9"  (1.753 m)   Wt 219 lb (99.3 kg)   SpO2 95%   BMI 32.34 kg/m   Wt Readings from Last 3 Encounters:  03/08/19 219 lb (99.3 kg)  02/04/19 211 lb (95.7 kg)  03/30/18 213 lb (96.6 kg)    Physical Exam Vitals signs and nursing note reviewed.  Constitutional:      General: He is awake. He is not in acute distress.    Appearance: He is well-developed. He is not ill-appearing.  HENT:     Head: Normocephalic and atraumatic.     Right Ear: Hearing, tympanic membrane, ear canal and external ear normal. No drainage.     Left Ear: Hearing, tympanic membrane, ear canal and external ear normal. No drainage.     Nose: Nose normal.     Mouth/Throat:     Pharynx: Oropharynx is clear. Uvula midline.  Eyes:     General: Lids are normal.        Right eye: No discharge.        Left eye: No discharge.      Extraocular Movements: Extraocular movements intact.     Conjunctiva/sclera: Conjunctivae normal.     Pupils: Pupils are equal, round, and reactive to light.     Visual Fields: Right  eye visual fields normal and left eye visual fields normal.  Neck:     Musculoskeletal: Normal range of motion and neck supple.     Thyroid: No thyromegaly.     Vascular: No carotid bruit or JVD.     Trachea: Trachea normal.  Cardiovascular:     Rate and Rhythm: Normal rate and regular rhythm.     Heart sounds: Normal heart sounds, S1 normal and S2 normal. No murmur. No gallop.   Pulmonary:     Effort: Pulmonary effort is normal. No accessory muscle usage or respiratory distress.     Breath sounds: Normal breath sounds.  Abdominal:     General: Bowel sounds are normal.     Palpations: Abdomen is soft. There is no hepatomegaly or splenomegaly.     Tenderness: There is no abdominal tenderness.     Hernia: No hernia is present.  Genitourinary:    Comments: Deferred per patient request Musculoskeletal: Normal range of motion.     Right lower leg: No edema.     Left lower leg: No edema.  Lymphadenopathy:     Head:     Right side of head: No submental, submandibular, tonsillar, preauricular, posterior auricular or occipital adenopathy.     Left side of head: No submental, submandibular, tonsillar, preauricular, posterior auricular or occipital adenopathy.     Cervical: No cervical adenopathy.  Skin:    General: Skin is warm and dry.     Capillary Refill: Capillary refill takes less than 2 seconds.     Findings: No rash.  Neurological:     Mental Status: He is alert and oriented to person, place, and time.     Cranial Nerves: Cranial nerves are intact.     Gait: Gait is intact.     Deep Tendon Reflexes: Reflexes are normal and symmetric.     Reflex Scores:      Brachioradialis reflexes are 2+ on the right side and 2+ on the left side.      Patellar reflexes are 2+ on the right side and 2+ on the left  side. Psychiatric:        Attention and Perception: Attention normal.        Mood and Affect: Mood normal.        Speech: Speech normal.        Behavior: Behavior normal. Behavior is cooperative.        Thought Content: Thought content normal.        Cognition and Memory: Cognition normal.        Judgment: Judgment normal.       Results for orders placed or performed in visit on 01/22/18  CBC with Differential/Platelet  Result Value Ref Range   WBC 6.1 3.4 - 10.8 x10E3/uL   RBC 4.73 4.14 - 5.80 x10E6/uL   Hemoglobin 14.1 13.0 - 17.7 g/dL   Hematocrit 16.1 09.6 - 51.0 %   MCV 88 79 - 97 fL   MCH 29.8 26.6 - 33.0 pg   MCHC 33.9 31.5 - 35.7 g/dL   RDW 04.5 40.9 - 81.1 %   Platelets 215 150 - 450 x10E3/uL   Neutrophils 65 Not Estab. %   Lymphs 28 Not Estab. %   Monocytes 6 Not Estab. %   Eos 1 Not Estab. %   Basos 0 Not Estab. %   Neutrophils Absolute 3.9 1.4 - 7.0 x10E3/uL   Lymphocytes Absolute 1.7 0.7 - 3.1 x10E3/uL   Monocytes Absolute 0.4 0.1 - 0.9  x10E3/uL   EOS (ABSOLUTE) 0.0 0.0 - 0.4 x10E3/uL   Basophils Absolute 0.0 0.0 - 0.2 x10E3/uL   Immature Granulocytes 0 Not Estab. %   Immature Grans (Abs) 0.0 0.0 - 0.1 x10E3/uL  Comprehensive metabolic panel  Result Value Ref Range   Glucose 108 (H) 65 - 99 mg/dL   BUN 14 6 - 24 mg/dL   Creatinine, Ser 9.601.12 0.76 - 1.27 mg/dL   GFR calc non Af Amer 77 >59 mL/min/1.73   GFR calc Af Amer 89 >59 mL/min/1.73   BUN/Creatinine Ratio 13 9 - 20   Sodium 141 134 - 144 mmol/L   Potassium 4.0 3.5 - 5.2 mmol/L   Chloride 104 96 - 106 mmol/L   CO2 22 20 - 29 mmol/L   Calcium 9.1 8.7 - 10.2 mg/dL   Total Protein 6.5 6.0 - 8.5 g/dL   Albumin 4.2 3.5 - 5.5 g/dL   Globulin, Total 2.3 1.5 - 4.5 g/dL   Albumin/Globulin Ratio 1.8 1.2 - 2.2   Bilirubin Total 1.0 0.0 - 1.2 mg/dL   Alkaline Phosphatase 55 39 - 117 IU/L   AST 18 0 - 40 IU/L   ALT 20 0 - 44 IU/L  Lipid panel  Result Value Ref Range   Cholesterol, Total 193 100 - 199  mg/dL   Triglycerides 454181 (H) 0 - 149 mg/dL   HDL 51 >09>39 mg/dL   VLDL Cholesterol Cal 36 5 - 40 mg/dL   LDL Calculated 811106 (H) 0 - 99 mg/dL   Chol/HDL Ratio 3.8 0.0 - 5.0 ratio  PSA  Result Value Ref Range   Prostate Specific Ag, Serum 0.6 0.0 - 4.0 ng/mL  TSH  Result Value Ref Range   TSH 0.747 0.450 - 4.500 uIU/mL  Urinalysis, Routine w reflex microscopic  Result Value Ref Range   Specific Gravity, UA 1.025 1.005 - 1.030   pH, UA 6.0 5.0 - 7.5   Color, UA Orange Yellow   Appearance Ur Clear Clear   Leukocytes, UA Negative Negative   Protein, UA Negative Negative/Trace   Glucose, UA Negative Negative   Ketones, UA Trace (A) Negative   RBC, UA Negative Negative   Bilirubin, UA Negative Negative   Urobilinogen, Ur 1.0 0.2 - 1.0 mg/dL   Nitrite, UA Negative Negative      Assessment & Plan:   Problem List Items Addressed This Visit      Cardiovascular and Mediastinum   Essential hypertension    Chronic, stable with BP at goal in office and on home readings.  Continue current medication regimen and adjust as needed.  Labs today.  Return in 6 months.        Relevant Orders   Comprehensive metabolic panel     Other   Vitamin D deficiency    Check Vit D level today.  Currently no supplements.      Relevant Orders   VITAMIN D 25 Hydroxy (Vit-D Deficiency, Fractures)   Pre-diabetes    Check A1C today and adjust plan of care as needed.  Continue diet focus at this time.      Relevant Orders   HgB A1c    Other Visit Diagnoses    Encounter for annual physical exam    -  Primary   Annual labs to include CBC, CMP, TSH, PSA, A1C, Vit D   Relevant Orders   CBC with Differential/Platelet   Comprehensive metabolic panel   Lipid Panel w/o Chol/HDL Ratio   PSA   TSH  Discussed aspirin prophylaxis for myocardial infarction prevention and decision was it was not indicated  LABORATORY TESTING:  Health maintenance labs ordered today as discussed above.   The  natural history of prostate cancer and ongoing controversy regarding screening and potential treatment outcomes of prostate cancer has been discussed with the patient. The meaning of a false positive PSA and a false negative PSA has been discussed. He indicates understanding of the limitations of this screening test and wishes  to proceed with screening PSA testing.   IMMUNIZATIONS:   - Tdap: Tetanus vaccination status reviewed: last tetanus booster within 10 years. - Influenza: Up to date - Pneumovax: Not applicable - Prevnar: Not applicable - Zostavax vaccine: discussed with patient and he wishes to think about this  SCREENING: - Colonoscopy: Up to date  Discussed with patient purpose of the colonoscopy is to detect colon cancer at curable precancerous or early stages   - AAA Screening: Not applicable  -Hearing Test: Not applicable  -Spirometry: Not applicable   PATIENT COUNSELING:    Sexuality: Discussed sexually transmitted diseases, partner selection, use of condoms, avoidance of unintended pregnancy  and contraceptive alternatives.   Advised to avoid cigarette smoking.  I discussed with the patient that most people either abstain from alcohol or drink within safe limits (<=14/week and <=4 drinks/occasion for males, <=7/weeks and <= 3 drinks/occasion for females) and that the risk for alcohol disorders and other health effects rises proportionally with the number of drinks per week and how often a drinker exceeds daily limits.  Discussed cessation/primary prevention of drug use and availability of treatment for abuse.   Diet: Encouraged to adjust caloric intake to maintain  or achieve ideal body weight, to reduce intake of dietary saturated fat and total fat, to limit sodium intake by avoiding high sodium foods and not adding table salt, and to maintain adequate dietary potassium and calcium preferably from fresh fruits, vegetables, and low-fat dairy products.    stressed the  importance of regular exercise  Injury prevention: Discussed safety belts, safety helmets, smoke detector, smoking near bedding or upholstery.   Dental health: Discussed importance of regular tooth brushing, flossing, and dental visits.   Follow up plan: NEXT PREVENTATIVE PHYSICAL DUE IN 1 YEAR. Return in about 6 months (around 09/08/2019) for HTN, prediabetes.

## 2019-03-10 ENCOUNTER — Other Ambulatory Visit: Payer: Self-pay | Admitting: Nurse Practitioner

## 2019-03-10 LAB — CBC WITH DIFFERENTIAL/PLATELET
Basophils Absolute: 0 10*3/uL (ref 0.0–0.2)
Basos: 0 %
EOS (ABSOLUTE): 0 10*3/uL (ref 0.0–0.4)
Eos: 0 %
Hematocrit: 41.9 % (ref 37.5–51.0)
Hemoglobin: 14.6 g/dL (ref 13.0–17.7)
Immature Grans (Abs): 0 10*3/uL (ref 0.0–0.1)
Immature Granulocytes: 0 %
Lymphocytes Absolute: 1.7 10*3/uL (ref 0.7–3.1)
Lymphs: 25 %
MCH: 30.3 pg (ref 26.6–33.0)
MCHC: 34.8 g/dL (ref 31.5–35.7)
MCV: 87 fL (ref 79–97)
Monocytes Absolute: 0.5 10*3/uL (ref 0.1–0.9)
Monocytes: 8 %
Neutrophils Absolute: 4.6 10*3/uL (ref 1.4–7.0)
Neutrophils: 67 %
Platelets: 228 10*3/uL (ref 150–450)
RBC: 4.82 x10E6/uL (ref 4.14–5.80)
RDW: 13.2 % (ref 11.6–15.4)
WBC: 6.9 10*3/uL (ref 3.4–10.8)

## 2019-03-10 LAB — COMPREHENSIVE METABOLIC PANEL
ALT: 24 IU/L (ref 0–44)
AST: 22 IU/L (ref 0–40)
Albumin/Globulin Ratio: 2.2 (ref 1.2–2.2)
Albumin: 4.8 g/dL (ref 4.0–5.0)
Alkaline Phosphatase: 59 IU/L (ref 39–117)
BUN/Creatinine Ratio: 15 (ref 9–20)
BUN: 15 mg/dL (ref 6–24)
Bilirubin Total: 0.9 mg/dL (ref 0.0–1.2)
CO2: 23 mmol/L (ref 20–29)
Calcium: 9.9 mg/dL (ref 8.7–10.2)
Chloride: 104 mmol/L (ref 96–106)
Creatinine, Ser: 1.03 mg/dL (ref 0.76–1.27)
GFR calc Af Amer: 97 mL/min/{1.73_m2} (ref >59)
GFR calc non Af Amer: 84 mL/min/{1.73_m2} (ref >59)
Globulin, Total: 2.2 g/dL (ref 1.5–4.5)
Glucose: 80 mg/dL (ref 65–99)
Potassium: 4.4 mmol/L (ref 3.5–5.2)
Sodium: 143 mmol/L (ref 134–144)
Total Protein: 7 g/dL (ref 6.0–8.5)

## 2019-03-10 LAB — VITAMIN D 25 HYDROXY (VIT D DEFICIENCY, FRACTURES): Vit D, 25-Hydroxy: 18.7 ng/mL — ABNORMAL LOW (ref 30.0–100.0)

## 2019-03-10 LAB — PSA: Prostate Specific Ag, Serum: 0.7 ng/mL (ref 0.0–4.0)

## 2019-03-10 LAB — HEMOGLOBIN A1C
Est. average glucose Bld gHb Est-mCnc: 126 mg/dL
Hgb A1c MFr Bld: 6 % — ABNORMAL HIGH (ref 4.8–5.6)

## 2019-03-10 LAB — LIPID PANEL W/O CHOL/HDL RATIO
Cholesterol, Total: 218 mg/dL — ABNORMAL HIGH (ref 100–199)
HDL: 52 mg/dL (ref >39)
LDL Calculated: 131 mg/dL — ABNORMAL HIGH (ref 0–99)
Triglycerides: 176 mg/dL — ABNORMAL HIGH (ref 0–149)
VLDL Cholesterol Cal: 35 mg/dL (ref 5–40)

## 2019-03-10 LAB — TSH: TSH: 1.51 u[IU]/mL (ref 0.450–4.500)

## 2019-03-10 MED ORDER — CHOLECALCIFEROL 1.25 MG (50000 UT) PO TABS: 1.0000 | ORAL_TABLET | ORAL | 0 refills | Status: DC

## 2019-03-10 NOTE — Progress Notes (Signed)
Vitamin D script.

## 2019-04-29 ENCOUNTER — Other Ambulatory Visit: Payer: Self-pay | Admitting: Nurse Practitioner

## 2019-04-29 NOTE — Telephone Encounter (Signed)
Can we see if we can schedule lab draw only visit for this patient?  He needs repeat Vit D level and then may be able to switch to OTC supplement.  Thanks.

## 2019-04-29 NOTE — Telephone Encounter (Signed)
Forwarding medication refill request to PCP for review. 

## 2019-09-09 ENCOUNTER — Ambulatory Visit: Payer: 59 | Admitting: Family Medicine

## 2020-04-27 ENCOUNTER — Encounter: Payer: Self-pay | Admitting: Nurse Practitioner

## 2020-04-27 ENCOUNTER — Other Ambulatory Visit: Payer: Self-pay

## 2020-04-27 ENCOUNTER — Ambulatory Visit (INDEPENDENT_AMBULATORY_CARE_PROVIDER_SITE_OTHER): Payer: BC Managed Care – PPO | Admitting: Nurse Practitioner

## 2020-04-27 VITALS — BP 117/78 | HR 57 | Temp 98.1°F | Wt 218.0 lb

## 2020-04-27 DIAGNOSIS — E559 Vitamin D deficiency, unspecified: Secondary | ICD-10-CM

## 2020-04-27 DIAGNOSIS — R7303 Prediabetes: Secondary | ICD-10-CM | POA: Diagnosis not present

## 2020-04-27 DIAGNOSIS — I10 Essential (primary) hypertension: Secondary | ICD-10-CM | POA: Diagnosis not present

## 2020-04-27 MED ORDER — BENAZEPRIL HCL 40 MG PO TABS: 40.0000 mg | ORAL_TABLET | Freq: Every day | ORAL | 4 refills | Status: DC

## 2020-04-27 MED ORDER — METOPROLOL SUCCINATE ER 50 MG PO TB24: 50.0000 mg | ORAL_TABLET | Freq: Every day | ORAL | 4 refills | Status: DC

## 2020-04-27 MED ORDER — AMLODIPINE BESYLATE 10 MG PO TABS: 10.0000 mg | ORAL_TABLET | Freq: Every day | ORAL | 4 refills | Status: DC

## 2020-04-27 NOTE — Progress Notes (Signed)
BP 117/78   Pulse (!) 57   Temp 98.1 F (36.7 C) (Oral)   Wt 218 lb (98.9 kg)   SpO2 97%   BMI 32.19 kg/m    Subjective:    Patient ID: Harold [name], male    DOB: 12-18-1965, 54 y.o.   MRN: [id number]  HPI: Harold [name] is a 54 y.o. male  Chief Complaint  Patient presents with  . Hypertension   HYPERTENSION Hypertension status: stable  Satisfied with current treatment? yes Duration of hypertension: chronic BP monitoring frequency:  occasionally BP range: on average 120/70 range BP medication side effects:  no Medication compliance: good compliance Aspirin: no Recurrent headaches: no Visual changes: no Palpitations: no Dyspnea: no Chest pain: no Lower extremity edema: no Dizzy/lightheaded: no  The 10-year ASCVD risk score Denman George DC Jr., et al., 2013) is: 8.3%   Values used to calculate the score:     Age: 26 years     Sex: Male     Is Non-Hispanic African American: Yes     Diabetic: No     Tobacco smoker: No     Systolic Blood Pressure: 117 mmHg     Is BP treated: Yes     HDL Cholesterol: 52 mg/dL     Total Cholesterol: 218 mg/dL  PREDIABETES: Recent labs noting glucose above 100, last A1C 6% in July 2020. Denies polyuria, polyphagia, polydipsia.  Vitamin D level 18.7 last visit in July 2020 and was prescribed supplement.  Is taking occasional Vitamin D, but has not refilled.    Relevant past medical, surgical, family and social history reviewed and updated as indicated. Interim medical history since our last visit reviewed. Allergies and medications reviewed and updated.  Review of Systems  Constitutional: Negative for activity change, diaphoresis, fatigue and fever.  Respiratory: Negative for cough, chest tightness, shortness of breath and wheezing.   Cardiovascular: Negative for chest pain, palpitations and leg swelling.  Gastrointestinal: Negative.   Endocrine: Negative for polydipsia, polyphagia and polyuria.  Neurological: Negative.    Psychiatric/Behavioral: Negative.     Per HPI unless specifically indicated above     Objective:    BP 117/78   Pulse (!) 57   Temp 98.1 F (36.7 C) (Oral)   Wt 218 lb (98.9 kg)   SpO2 97%   BMI 32.19 kg/m   Wt Readings from Last 3 Encounters:  04/27/20 218 lb (98.9 kg)  03/08/19 219 lb (99.3 kg)  02/04/19 211 lb (95.7 kg)    Physical Exam Vitals and nursing note reviewed.  Constitutional:      General: He is awake.     Appearance: He is well-developed and well-groomed. He is obese.  HENT:     Head: Normocephalic and atraumatic.     Right Ear: Hearing normal. No drainage.     Left Ear: Hearing normal. No drainage.     Mouth/Throat:     Pharynx: Uvula midline.  Eyes:     General: Lids are normal.        Right eye: No discharge.        Left eye: No discharge.     Conjunctiva/sclera: Conjunctivae normal.     Pupils: Pupils are equal, round, and reactive to light.  Neck:     Thyroid: No thyromegaly.     Vascular: No carotid bruit or JVD.     Trachea: Trachea normal.  Cardiovascular:     Rate and Rhythm: Normal rate and regular rhythm.  Heart sounds: Normal heart sounds, S1 normal and S2 normal. No murmur heard.  No gallop.   Pulmonary:     Effort: Pulmonary effort is normal.     Breath sounds: Normal breath sounds.  Abdominal:     General: Bowel sounds are normal.     Palpations: Abdomen is soft. There is no hepatomegaly or splenomegaly.  Musculoskeletal:        General: Normal range of motion.     Cervical back: Normal range of motion and neck supple.     Right lower leg: No edema.     Left lower leg: No edema.  Skin:    General: Skin is warm and dry.     Capillary Refill: Capillary refill takes less than 2 seconds.     Findings: No rash.  Neurological:     Mental Status: He is alert and oriented to person, place, and time.     Deep Tendon Reflexes: Reflexes are normal and symmetric.  Psychiatric:        Mood and Affect: Mood normal.         Behavior: Behavior normal. Behavior is cooperative.        Thought Content: Thought content normal.        Judgment: Judgment normal.     Results for orders placed or performed in visit on 03/08/19  CBC with Differential/Platelet  Result Value Ref Range   WBC 6.9 3.4 - 10.8 x10E3/uL   RBC 4.82 4.14 - 5.80 x10E6/uL   Hemoglobin 14.6 13.0 - 17.7 g/dL   Hematocrit 33.8 25.0 - 51.0 %   MCV 87 79 - 97 fL   MCH 30.3 26.6 - 33.0 pg   MCHC 34.8 31 - 35 g/dL   RDW 53.9 76.7 - 34.1 %   Platelets 228 150 - 450 x10E3/uL   Neutrophils 67 Not Estab. %   Lymphs 25 Not Estab. %   Monocytes 8 Not Estab. %   Eos 0 Not Estab. %   Basos 0 Not Estab. %   Neutrophils Absolute 4.6 1 - 7 x10E3/uL   Lymphocytes Absolute 1.7 0 - 3 x10E3/uL   Monocytes Absolute 0.5 0 - 0 x10E3/uL   EOS (ABSOLUTE) 0.0 0.0 - 0.4 x10E3/uL   Basophils Absolute 0.0 0 - 0 x10E3/uL   Immature Granulocytes 0 Not Estab. %   Immature Grans (Abs) 0.0 0.0 - 0.1 x10E3/uL  Comprehensive metabolic panel  Result Value Ref Range   Glucose 80 65 - 99 mg/dL   BUN 15 6 - 24 mg/dL   Creatinine, Ser 9.37 0.76 - 1.27 mg/dL   GFR calc non Af Amer 84 >59 mL/min/1.73   GFR calc Af Amer 97 >59 mL/min/1.73   BUN/Creatinine Ratio 15 9 - 20   Sodium 143 134 - 144 mmol/L   Potassium 4.4 3.5 - 5.2 mmol/L   Chloride 104 96 - 106 mmol/L   CO2 23 20 - 29 mmol/L   Calcium 9.9 8.7 - 10.2 mg/dL   Total Protein 7.0 6.0 - 8.5 g/dL   Albumin 4.8 4.0 - 5.0 g/dL   Globulin, Total 2.2 1.5 - 4.5 g/dL   Albumin/Globulin Ratio 2.2 1.2 - 2.2   Bilirubin Total 0.9 0.0 - 1.2 mg/dL   Alkaline Phosphatase 59 39 - 117 IU/L   AST 22 0 - 40 IU/L   ALT 24 0 - 44 IU/L  Lipid Panel w/o Chol/HDL Ratio  Result Value Ref Range   Cholesterol, Total 218 (H) 100 - 199  mg/dL   Triglycerides 932 (H) 0 - 149 mg/dL   HDL 52 >67 mg/dL   VLDL Cholesterol Cal 35 5 - 40 mg/dL   LDL Calculated 124 (H) 0 - 99 mg/dL  PSA  Result Value Ref Range   Prostate Specific Ag, Serum  0.7 0.0 - 4.0 ng/mL  TSH  Result Value Ref Range   TSH 1.510 0.450 - 4.500 uIU/mL  HgB A1c  Result Value Ref Range   Hgb A1c MFr Bld 6.0 (H) 4.8 - 5.6 %   Est. average glucose Bld gHb Est-mCnc 126 mg/dL  VITAMIN D 25 Hydroxy (Vit-D Deficiency, Fractures)  Result Value Ref Range   Vit D, 25-Hydroxy 18.7 (L) 30.0 - 100.0 ng/mL      Assessment & Plan:   Problem List Items Addressed This Visit      Cardiovascular and Mediastinum   Essential hypertension - Primary    Chronic, stable with BP at goal in office and on home readings.  Continue current medication regimen and adjust as needed.  DASH diet focus.  Refills sent in.  Return in October as scheduled for physical and labs.      Relevant Medications   metoprolol succinate (TOPROL-XL) 50 MG 24 hr tablet   benazepril (LOTENSIN) 40 MG tablet   amLODipine (NORVASC) 10 MG tablet     Other   Vitamin D deficiency    Continue supplements daily.  Return in October as scheduled for physical and labs.      Pre-diabetes    Continue diet focus at this time. Return in October as scheduled for physical and labs.          Follow up plan: Return for as scheduled in October.

## 2020-04-27 NOTE — Patient Instructions (Signed)
DASH Eating Plan DASH stands for "Dietary Approaches to Stop Hypertension." The DASH eating plan is a healthy eating plan that has been shown to reduce high blood pressure (hypertension). It may also reduce your risk for type 2 diabetes, heart disease, and stroke. The DASH eating plan may also help with weight loss. What are tips for following this plan?  General guidelines  Avoid eating more than 2,300 mg (milligrams) of salt (sodium) a day. If you have hypertension, you may need to reduce your sodium intake to 1,500 mg a day.  Limit alcohol intake to no more than 1 drink a day for nonpregnant women and 2 drinks a day for men. One drink equals 12 oz of beer, 5 oz of wine, or 1 oz of hard liquor.  Work with your health care provider to maintain a healthy body weight or to lose weight. Ask what an ideal weight is for you.  Get at least 30 minutes of exercise that causes your heart to beat faster (aerobic exercise) most days of the week. Activities may include walking, swimming, or biking.  Work with your health care provider or diet and nutrition specialist (dietitian) to adjust your eating plan to your individual calorie needs. Reading food labels   Check food labels for the amount of sodium per serving. Choose foods with less than 5 percent of the Daily Value of sodium. Generally, foods with less than 300 mg of sodium per serving fit into this eating plan.  To find whole grains, look for the word "whole" as the first word in the ingredient list. Shopping  Buy products labeled as "low-sodium" or "no salt added."  Buy fresh foods. Avoid canned foods and premade or frozen meals. Cooking  Avoid adding salt when cooking. Use salt-free seasonings or herbs instead of table salt or sea salt. Check with your health care provider or pharmacist before using salt substitutes.  Do not fry foods. Cook foods using healthy methods such as baking, boiling, grilling, and broiling instead.  Cook with  heart-healthy oils, such as olive, canola, soybean, or sunflower oil. Meal planning  Eat a balanced diet that includes: ? 5 or more servings of fruits and vegetables each day. At each meal, try to fill half of your plate with fruits and vegetables. ? Up to 6-8 servings of whole grains each day. ? Less than 6 oz of lean meat, poultry, or fish each day. A 3-oz serving of meat is about the same size as a deck of cards. One egg equals 1 oz. ? 2 servings of low-fat dairy each day. ? A serving of nuts, seeds, or beans 5 times each week. ? Heart-healthy fats. Healthy fats called Omega-3 fatty acids are found in foods such as flaxseeds and coldwater fish, like sardines, salmon, and mackerel.  Limit how much you eat of the following: ? Canned or prepackaged foods. ? Food that is high in trans fat, such as fried foods. ? Food that is high in saturated fat, such as fatty meat. ? Sweets, desserts, sugary drinks, and other foods with added sugar. ? Full-fat dairy products.  Do not salt foods before eating.  Try to eat at least 2 vegetarian meals each week.  Eat more home-cooked food and less restaurant, buffet, and fast food.  When eating at a restaurant, ask that your food be prepared with less salt or no salt, if possible. What foods are recommended? The items listed may not be a complete list. Talk with your dietitian about   what dietary choices are best for you. Grains Whole-grain or whole-wheat bread. Whole-grain or whole-wheat pasta. Brown rice. Oatmeal. Quinoa. Bulgur. Whole-grain and low-sodium cereals. Pita bread. Low-fat, low-sodium crackers. Whole-wheat flour tortillas. Vegetables Fresh or frozen vegetables (raw, steamed, roasted, or grilled). Low-sodium or reduced-sodium tomato and vegetable juice. Low-sodium or reduced-sodium tomato sauce and tomato paste. Low-sodium or reduced-sodium canned vegetables. Fruits All fresh, dried, or frozen fruit. Canned fruit in natural juice (without  added sugar). Meat and other protein foods Skinless chicken or turkey. Ground chicken or turkey. Pork with fat trimmed off. Fish and seafood. Egg whites. Dried beans, peas, or lentils. Unsalted nuts, nut butters, and seeds. Unsalted canned beans. Lean cuts of beef with fat trimmed off. Low-sodium, lean deli meat. Dairy Low-fat (1%) or fat-free (skim) milk. Fat-free, low-fat, or reduced-fat cheeses. Nonfat, low-sodium ricotta or cottage cheese. Low-fat or nonfat yogurt. Low-fat, low-sodium cheese. Fats and oils Soft margarine without trans fats. Vegetable oil. Low-fat, reduced-fat, or light mayonnaise and salad dressings (reduced-sodium). Canola, safflower, olive, soybean, and sunflower oils. Avocado. Seasoning and other foods Herbs. Spices. Seasoning mixes without salt. Unsalted popcorn and pretzels. Fat-free sweets. What foods are not recommended? The items listed may not be a complete list. Talk with your dietitian about what dietary choices are best for you. Grains Baked goods made with fat, such as croissants, muffins, or some breads. Dry pasta or rice meal packs. Vegetables Creamed or fried vegetables. Vegetables in a cheese sauce. Regular canned vegetables (not low-sodium or reduced-sodium). Regular canned tomato sauce and paste (not low-sodium or reduced-sodium). Regular tomato and vegetable juice (not low-sodium or reduced-sodium). Pickles. Olives. Fruits Canned fruit in a light or heavy syrup. Fried fruit. Fruit in cream or butter sauce. Meat and other protein foods Fatty cuts of meat. Ribs. Fried meat. Bacon. Sausage. Bologna and other processed lunch meats. Salami. Fatback. Hotdogs. Bratwurst. Salted nuts and seeds. Canned beans with added salt. Canned or smoked fish. Whole eggs or egg yolks. Chicken or turkey with skin. Dairy Whole or 2% milk, cream, and half-and-half. Whole or full-fat cream cheese. Whole-fat or sweetened yogurt. Full-fat cheese. Nondairy creamers. Whipped toppings.  Processed cheese and cheese spreads. Fats and oils Butter. Stick margarine. Lard. Shortening. Ghee. Bacon fat. Tropical oils, such as coconut, palm kernel, or palm oil. Seasoning and other foods Salted popcorn and pretzels. Onion salt, garlic salt, seasoned salt, table salt, and sea salt. Worcestershire sauce. Tartar sauce. Barbecue sauce. Teriyaki sauce. Soy sauce, including reduced-sodium. Steak sauce. Canned and packaged gravies. Fish sauce. Oyster sauce. Cocktail sauce. Horseradish that you find on the shelf. Ketchup. Mustard. Meat flavorings and tenderizers. Bouillon cubes. Hot sauce and Tabasco sauce. Premade or packaged marinades. Premade or packaged taco seasonings. Relishes. Regular salad dressings. Where to find more information:  National Heart, Lung, and Blood Institute: www.nhlbi.nih.gov  American Heart Association: www.heart.org Summary  The DASH eating plan is a healthy eating plan that has been shown to reduce high blood pressure (hypertension). It may also reduce your risk for type 2 diabetes, heart disease, and stroke.  With the DASH eating plan, you should limit salt (sodium) intake to 2,300 mg a day. If you have hypertension, you may need to reduce your sodium intake to 1,500 mg a day.  When on the DASH eating plan, aim to eat more fresh fruits and vegetables, whole grains, lean proteins, low-fat dairy, and heart-healthy fats.  Work with your health care provider or diet and nutrition specialist (dietitian) to adjust your eating plan to your   individual calorie needs. This information is not intended to replace advice given to you by your health care provider. Make sure you discuss any questions you have with your health care provider. Document Revised: 07/18/2017 Document Reviewed: 07/29/2016 Elsevier Patient Education  2020 [employer].  

## 2020-04-27 NOTE — Assessment & Plan Note (Signed)
Continue supplements daily.  Return in October as scheduled for physical and labs.

## 2020-04-27 NOTE — Assessment & Plan Note (Signed)
Continue diet focus at this time. Return in October as scheduled for physical and labs.

## 2020-04-27 NOTE — Assessment & Plan Note (Signed)
Chronic, stable with BP at goal in office and on home readings.  Continue current medication regimen and adjust as needed.  DASH diet focus.  Refills sent in.  Return in October as scheduled for physical and labs.

## 2020-05-22 ENCOUNTER — Other Ambulatory Visit: Payer: Self-pay

## 2020-05-22 ENCOUNTER — Ambulatory Visit (INDEPENDENT_AMBULATORY_CARE_PROVIDER_SITE_OTHER): Payer: BC Managed Care – PPO | Admitting: Nurse Practitioner

## 2020-05-22 ENCOUNTER — Encounter: Payer: Self-pay | Admitting: Nurse Practitioner

## 2020-05-22 VITALS — BP 127/82 | HR 74 | Temp 98.8°F | Ht 70.0 in | Wt 214.0 lb

## 2020-05-22 DIAGNOSIS — Z23 Encounter for immunization: Secondary | ICD-10-CM

## 2020-05-22 DIAGNOSIS — Z0000 Encounter for general adult medical examination without abnormal findings: Secondary | ICD-10-CM

## 2020-05-22 NOTE — Progress Notes (Signed)
BP 127/82 (BP Location: Left Arm, Patient Position: Sitting, Cuff Size: Normal)    Pulse 74    Temp 98.8 F (37.1 C) (Oral)    Ht 5\' 10"  (1.778 m)    Wt 214 lb (97.1 kg)    SpO2 99%    BMI 30.71 kg/m    Subjective:    Patient ID: [name], male    DOB: 1965-12-06, 54 y.o.   MRN: [id number]  HPI: Harold [name] is a 54 y.o. male presenting on 05/22/2020 for comprehensive medical examination. Current medical complaints include:none  He currently lives with: wife Interim Problems from his last visit: no   Functional Status Survey: Is the patient deaf or have difficulty hearing?: No Does the patient have difficulty seeing, even when wearing glasses/contacts?: No Does the patient have difficulty concentrating, remembering, or making decisions?: No Does the patient have difficulty walking or climbing stairs?: No Does the patient have difficulty dressing or bathing?: No Does the patient have difficulty doing errands alone such as visiting a doctor's office or shopping?: No  FALL RISK: Fall Risk  04/27/2020 03/30/2018 02/26/2018 01/22/2018 11/06/2017  Falls in the past year? 0 No No No No  Number falls in past yr: 0 - - - -  Injury with Fall? 0 - - - -    Depression Screen Depression screen [facility] 2/9 04/27/2020 03/08/2019 01/22/2018 11/06/2017 08/28/2017  Decreased Interest 0 0 0 0 0  Down, Depressed, Hopeless 0 0 0 0 0  PHQ - 2 Score 0 0 0 0 0  Altered sleeping 1 2 - 0 -  Tired, decreased energy 1 2 - 0 -  Change in appetite 0 1 - 0 -  Feeling bad or failure about yourself  1 0 - 0 -  Trouble concentrating 0 0 - 0 -  Moving slowly or fidgety/restless 0 0 - 0 -  Suicidal thoughts 0 0 - 0 -  PHQ-9 Score 3 5 - 0 -  Difficult doing work/chores - Not difficult at all - - -    Advanced Directives <no information>  Past Medical History:  History reviewed. No pertinent past medical history.  Surgical History:  Past Surgical History:  Procedure Laterality Date   wisdom  teeth removed      Medications:  Current Outpatient Medications on File Prior to Visit  Medication Sig   amLODipine (NORVASC) 10 MG tablet Take 1 tablet (10 mg total) by mouth daily.   benazepril (LOTENSIN) 40 MG tablet Take 1 tablet (40 mg total) by mouth daily.   metoprolol succinate (TOPROL-XL) 50 MG 24 hr tablet Take 1 tablet (50 mg total) by mouth daily. Take with or immediately following a meal.   No current facility-administered medications on file prior to visit.    Allergies:  Allergies  Allergen Reactions   Hct [Hydrochlorothiazide] Other (See Comments)    ED    Social History:  Social History   Socioeconomic History   Marital status: Married    Spouse name: Not on file   Number of children: Not on file   Years of education: Not on file   Highest education level: Not on file  Occupational History   Not on file  Tobacco Use   Smoking status: Never Smoker   Smokeless tobacco: Never Used  Vaping Use   Vaping Use: Never used  Substance and Sexual Activity   Alcohol use: No   Drug use: No   Sexual activity: Not on file  Other Topics Concern   Not on file  Social History Narrative   Not on file   Social Determinants of Health   Financial Resource Strain:    Difficulty of Paying Living Expenses: Not on file  Food Insecurity:    Worried About Running Out of Food in the Last Year: Not on file   Ran Out of Food in the Last Year: Not on file  Transportation Needs:    Lack of Transportation (Medical): Not on file   Lack of Transportation (Non-Medical): Not on file  Physical Activity:    Days of Exercise per Week: Not on file   Minutes of Exercise per Session: Not on file  Stress:    Feeling of Stress : Not on file  Social Connections:    Frequency of Communication with Friends and Family: Not on file   Frequency of Social Gatherings with Friends and Family: Not on file   Attends Religious Services: Not on file   Active Member  of Clubs or Organizations: Not on file   Attends BankerClub or Organization Meetings: Not on file   Marital Status: Not on file  Intimate Partner Violence:    Fear of Current or Ex-Partner: Not on file   Emotionally Abused: Not on file   Physically Abused: Not on file   Sexually Abused: Not on file   Social History   Tobacco Use  Smoking Status Never Smoker  Smokeless Tobacco Never Used   Social History   Substance and Sexual Activity  Alcohol Use No    Family History:  Family History  Problem Relation Age of Onset   Parkinson's disease Mother    Hypertension Mother    Alcohol abuse Father    Hypertension Father     Past medical history, surgical history, medications, allergies, family history and social history reviewed with patient today and changes made to appropriate areas of the chart.   Review of Systems - negative All other ROS negative except what is listed above and in the HPI.      Objective:    BP 127/82 (BP Location: Left Arm, Patient Position: Sitting, Cuff Size: Normal)    Pulse 74    Temp 98.8 F (37.1 C) (Oral)    Ht 5\' 10"  (1.778 m)    Wt 214 lb (97.1 kg)    SpO2 99%    BMI 30.71 kg/m   Wt Readings from Last 3 Encounters:  05/22/20 214 lb (97.1 kg)  04/27/20 218 lb (98.9 kg)  03/08/19 219 lb (99.3 kg)    Physical Exam Vitals and nursing note reviewed.  Constitutional:      General: He is awake. He is not in acute distress.    Appearance: He is well-developed and well-groomed. He is obese. He is not ill-appearing.  HENT:     Head: Normocephalic and atraumatic.     Right Ear: Hearing, tympanic membrane, ear canal and external ear normal. No drainage.     Left Ear: Hearing, tympanic membrane, ear canal and external ear normal. No drainage.     Nose: Nose normal.     Mouth/Throat:     Pharynx: Uvula midline.  Eyes:     General: Lids are normal.        Right eye: No discharge.        Left eye: No discharge.     Extraocular Movements:  Extraocular movements intact.     Conjunctiva/sclera: Conjunctivae normal.     Pupils: Pupils are equal, round, and  reactive to light.     Visual Fields: Right eye visual fields normal and left eye visual fields normal.  Neck:     Thyroid: No thyromegaly.     Vascular: No carotid bruit or JVD.     Trachea: Trachea normal.  Cardiovascular:     Rate and Rhythm: Normal rate and regular rhythm.     Heart sounds: Normal heart sounds, S1 normal and S2 normal. No murmur heard.  No gallop.   Pulmonary:     Effort: Pulmonary effort is normal. No accessory muscle usage or respiratory distress.     Breath sounds: Normal breath sounds.  Abdominal:     General: Bowel sounds are normal.     Palpations: Abdomen is soft. There is no hepatomegaly or splenomegaly.     Tenderness: There is no abdominal tenderness.  Musculoskeletal:        General: Normal range of motion.     Cervical back: Normal range of motion and neck supple.     Right lower leg: No edema.     Left lower leg: No edema.  Lymphadenopathy:     Head:     Right side of head: No submental, submandibular, tonsillar, preauricular or posterior auricular adenopathy.     Left side of head: No submental, submandibular, tonsillar, preauricular or posterior auricular adenopathy.     Cervical: No cervical adenopathy.  Skin:    General: Skin is warm and dry.     Capillary Refill: Capillary refill takes less than 2 seconds.     Findings: No rash.  Neurological:     Mental Status: He is alert and oriented to person, place, and time.     Cranial Nerves: Cranial nerves are intact.     Gait: Gait is intact.     Deep Tendon Reflexes: Reflexes are normal and symmetric.     Reflex Scores:      Brachioradialis reflexes are 2+ on the right side and 2+ on the left side.      Patellar reflexes are 2+ on the right side and 2+ on the left side. Psychiatric:        Attention and Perception: Attention normal.        Mood and Affect: Mood normal.         Speech: Speech normal.        Behavior: Behavior normal. Behavior is cooperative.        Thought Content: Thought content normal.        Cognition and Memory: Cognition normal.        Judgment: Judgment normal.     Results for orders placed or performed in visit on 03/08/19  CBC with Differential/Platelet  Result Value Ref Range   WBC 6.9 3.4 - 10.8 x10E3/uL   RBC 4.82 4.14 - 5.80 x10E6/uL   Hemoglobin 14.6 13.0 - 17.7 g/dL   Hematocrit 95.6 21.3 - 51.0 %   MCV 87 79 - 97 fL   MCH 30.3 26.6 - 33.0 pg   MCHC 34.8 31 - 35 g/dL   RDW 08.6 57.8 - 46.9 %   Platelets 228 150 - 450 x10E3/uL   Neutrophils 67 Not Estab. %   Lymphs 25 Not Estab. %   Monocytes 8 Not Estab. %   Eos 0 Not Estab. %   Basos 0 Not Estab. %   Neutrophils Absolute 4.6 1 - 7 x10E3/uL   Lymphocytes Absolute 1.7 0 - 3 x10E3/uL   Monocytes Absolute 0.5 0 - 0 x10E3/uL  EOS (ABSOLUTE) 0.0 0.0 - 0.4 x10E3/uL   Basophils Absolute 0.0 0 - 0 x10E3/uL   Immature Granulocytes 0 Not Estab. %   Immature Grans (Abs) 0.0 0.0 - 0.1 x10E3/uL  Comprehensive metabolic panel  Result Value Ref Range   Glucose 80 65 - 99 mg/dL   BUN 15 6 - 24 mg/dL   Creatinine, Ser 1.32 0.76 - 1.27 mg/dL   GFR calc non Af Amer 84 >59 mL/min/1.73   GFR calc Af Amer 97 >59 mL/min/1.73   BUN/Creatinine Ratio 15 9 - 20   Sodium 143 134 - 144 mmol/L   Potassium 4.4 3.5 - 5.2 mmol/L   Chloride 104 96 - 106 mmol/L   CO2 23 20 - 29 mmol/L   Calcium 9.9 8.7 - 10.2 mg/dL   Total Protein 7.0 6.0 - 8.5 g/dL   Albumin 4.8 4.0 - 5.0 g/dL   Globulin, Total 2.2 1.5 - 4.5 g/dL   Albumin/Globulin Ratio 2.2 1.2 - 2.2   Bilirubin Total 0.9 0.0 - 1.2 mg/dL   Alkaline Phosphatase 59 39 - 117 IU/L   AST 22 0 - 40 IU/L   ALT 24 0 - 44 IU/L  Lipid Panel w/o Chol/HDL Ratio  Result Value Ref Range   Cholesterol, Total 218 (H) 100 - 199 mg/dL   Triglycerides 440 (H) 0 - 149 mg/dL   HDL 52 >10 mg/dL   VLDL Cholesterol Cal 35 5 - 40 mg/dL   LDL Calculated 272  (H) 0 - 99 mg/dL  PSA  Result Value Ref Range   Prostate Specific Ag, Serum 0.7 0.0 - 4.0 ng/mL  TSH  Result Value Ref Range   TSH 1.510 0.450 - 4.500 uIU/mL  HgB A1c  Result Value Ref Range   Hgb A1c MFr Bld 6.0 (H) 4.8 - 5.6 %   Est. average glucose Bld gHb Est-mCnc 126 mg/dL  VITAMIN D 25 Hydroxy (Vit-D Deficiency, Fractures)  Result Value Ref Range   Vit D, 25-Hydroxy 18.7 (L) 30.0 - 100.0 ng/mL      Assessment & Plan:   Problem List Items Addressed This Visit    None    Visit Diagnoses    Encounter for annual physical exam    -  Primary   Annual physical exam labs today to include CBC, CMP, TSH, PSA, A1C, and lipid.   Relevant Orders   CBC with Differential/Platelet   Comprehensive metabolic panel   Lipid Panel w/o Chol/HDL Ratio   TSH   PSA   HgB A1c   Influenza vaccine needed       Flu vaccine today   Relevant Orders   Flu Vaccine QUAD 36+ mos IM (Completed)      Discussed aspirin prophylaxis for myocardial infarction prevention and decision was it was not indicated  LABORATORY TESTING:  Health maintenance labs ordered today as discussed above.   The natural history of prostate cancer and ongoing controversy regarding screening and potential treatment outcomes of prostate cancer has been discussed with the patient. The meaning of a false positive PSA and a false negative PSA has been discussed. He indicates understanding of the limitations of this screening test and wishes to proceed with screening PSA testing.   IMMUNIZATIONS:   - Tdap: Tetanus vaccination status reviewed: last tetanus booster within 10 years. - Influenza: Administered today - Pneumovax: Not applicable - Prevnar: Not applicable - Zostavax vaccine: Refused  SCREENING: - Colonoscopy: Up to date  Discussed with patient purpose of the colonoscopy is to  detect colon cancer at curable precancerous or early stages   - AAA Screening: Not applicable  -Hearing Test: Not applicable  -Spirometry:  Not applicable   PATIENT COUNSELING:    Sexuality: Discussed sexually transmitted diseases, partner selection, use of condoms, avoidance of unintended pregnancy  and contraceptive alternatives.   Advised to avoid cigarette smoking.  I discussed with the patient that most people either abstain from alcohol or drink within safe limits (<=14/week and <=4 drinks/occasion for males, <=7/weeks and <= 3 drinks/occasion for females) and that the risk for alcohol disorders and other health effects rises proportionally with the number of drinks per week and how often a drinker exceeds daily limits.  Discussed cessation/primary prevention of drug use and availability of treatment for abuse.   Diet: Encouraged to adjust caloric intake to maintain  or achieve ideal body weight, to reduce intake of dietary saturated fat and total fat, to limit sodium intake by avoiding high sodium foods and not adding table salt, and to maintain adequate dietary potassium and calcium preferably from fresh fruits, vegetables, and low-fat dairy products.    stressed the importance of regular exercise  Injury prevention: Discussed safety belts, safety helmets, smoke detector, smoking near bedding or upholstery.   Dental health: Discussed importance of regular tooth brushing, flossing, and dental visits.   Follow up plan: NEXT PREVENTATIVE PHYSICAL DUE IN 1 YEAR. Return in about 6 months (around 11/20/2020) for HTN, PREDIABETES.

## 2020-05-22 NOTE — Patient Instructions (Signed)
Healthy Eating Following a healthy eating pattern may help you to achieve and maintain a healthy body weight, reduce the risk of chronic disease, and live a long and productive life. It is important to follow a healthy eating pattern at an appropriate calorie level for your body. Your nutritional needs should be met primarily through food by choosing a variety of nutrient-rich foods. What are tips for following this plan? Reading food labels  Read labels and choose the following: ? Reduced or low sodium. ? Juices with 100% fruit juice. ? Foods with low saturated fats and high polyunsaturated and monounsaturated fats. ? Foods with whole grains, such as whole wheat, cracked wheat, brown rice, and wild rice. ? Whole grains that are fortified with folic acid. This is recommended for women who are pregnant or who want to become pregnant.  Read labels and avoid the following: ? Foods with a lot of added sugars. These include foods that contain brown sugar, corn sweetener, corn syrup, dextrose, fructose, glucose, high-fructose corn syrup, honey, invert sugar, lactose, malt syrup, maltose, molasses, raw sugar, sucrose, trehalose, or turbinado sugar.  Do not eat more than the following amounts of added sugar per day:  6 teaspoons (25 g) for women.  9 teaspoons (38 g) for men. ? Foods that contain processed or refined starches and grains. ? Refined grain products, such as white flour, degermed cornmeal, white bread, and white rice. Shopping  Choose nutrient-rich snacks, such as vegetables, whole fruits, and nuts. Avoid high-calorie and high-sugar snacks, such as potato chips, fruit snacks, and candy.  Use oil-based dressings and spreads on foods instead of solid fats such as butter, stick margarine, or cream cheese.  Limit pre-made sauces, mixes, and "instant" products such as flavored rice, instant noodles, and ready-made pasta.  Try more plant-protein sources, such as tofu, tempeh, black beans,  edamame, lentils, nuts, and seeds.  Explore eating plans such as the Mediterranean diet or vegetarian diet. Cooking  Use oil to saut or stir-fry foods instead of solid fats such as butter, stick margarine, or lard.  Try baking, boiling, grilling, or broiling instead of frying.  Remove the fatty part of meats before cooking.  Steam vegetables in water or broth. Meal planning   At meals, imagine dividing your plate into fourths: ? One-half of your plate is fruits and vegetables. ? One-fourth of your plate is whole grains. ? One-fourth of your plate is protein, especially lean meats, poultry, eggs, tofu, beans, or nuts.  Include low-fat dairy as part of your daily diet. Lifestyle  Choose healthy options in all settings, including home, work, school, restaurants, or stores.  Prepare your food safely: ? Wash your hands after handling raw meats. ? Keep food preparation surfaces clean by regularly washing with hot, soapy water. ? Keep raw meats separate from ready-to-eat foods, such as fruits and vegetables. ? Cook seafood, meat, poultry, and eggs to the recommended internal temperature. ? Store foods at safe temperatures. In general:  Keep cold foods at 59F (4.4C) or below.  Keep hot foods at 159F (60C) or above.  Keep your freezer at [facility] (-17.8C) or below.  Foods are no longer safe to eat when they have been between the temperatures of 40-159F (4.4-60C) for more than 2 hours. What foods should I eat? Fruits Aim to eat 2 cup-equivalents of fresh, canned (in natural juice), or frozen fruits each day. Examples of 1 cup-equivalent of fruit include 1 small apple, 8 large strawberries, 1 cup canned fruit,  cup  dried fruit, or 1 cup 100% juice. Vegetables Aim to eat 2-3 cup-equivalents of fresh and frozen vegetables each day, including different varieties and colors. Examples of 1 cup-equivalent of vegetables include 2 medium carrots, 2 cups raw, leafy greens, 1 cup chopped  vegetable (raw or cooked), or 1 medium baked potato. Grains Aim to eat 6 ounce-equivalents of whole grains each day. Examples of 1 ounce-equivalent of grains include 1 slice of bread, 1 cup ready-to-eat cereal, 3 cups popcorn, or  cup cooked rice, pasta, or cereal. Meats and other proteins Aim to eat 5-6 ounce-equivalents of protein each day. Examples of 1 ounce-equivalent of protein include 1 egg, 1/2 cup nuts or seeds, or 1 tablespoon (16 g) peanut butter. A cut of meat or fish that is the size of a deck of cards is about 3-4 ounce-equivalents.  Of the protein you eat each week, try to have at least 8 ounces come from seafood. This includes salmon, trout, herring, and anchovies. Dairy Aim to eat 3 cup-equivalents of fat-free or low-fat dairy each day. Examples of 1 cup-equivalent of dairy include 1 cup (240 mL) milk, 8 ounces (250 g) yogurt, 1 ounces (44 g) natural cheese, or 1 cup (240 mL) fortified soy milk. Fats and oils  Aim for about 5 teaspoons (21 g) per day. Choose monounsaturated fats, such as canola and olive oils, avocados, peanut butter, and most nuts, or polyunsaturated fats, such as sunflower, corn, and soybean oils, walnuts, pine nuts, sesame seeds, sunflower seeds, and flaxseed. Beverages  Aim for six 8-oz glasses of water per day. Limit coffee to three to five 8-oz cups per day.  Limit caffeinated beverages that have added calories, such as soda and energy drinks.  Limit alcohol intake to no more than 1 drink a day for nonpregnant women and 2 drinks a day for men. One drink equals 12 oz of beer (355 mL), 5 oz of wine (148 mL), or 1 oz of hard liquor (44 mL). Seasoning and other foods  Avoid adding excess amounts of salt to your foods. Try flavoring foods with herbs and spices instead of salt.  Avoid adding sugar to foods.  Try using oil-based dressings, sauces, and spreads instead of solid fats. This information is based on general [place] nutrition guidelines. For more  information, visit [employer]. Exact amounts may vary based on your nutrition needs. Summary  A healthy eating plan may help you to maintain a healthy weight, reduce the risk of chronic diseases, and stay active throughout your life.  Plan your meals. Make sure you eat the right portions of a variety of nutrient-rich foods.  Try baking, boiling, grilling, or broiling instead of frying.  Choose healthy options in all settings, including home, work, school, restaurants, or stores. This information is not intended to replace advice given to you by your health care provider. Make sure you discuss any questions you have with your health care provider. Document Revised: 11/17/2017 Document Reviewed: 11/17/2017 Elsevier Patient Education  [place].

## 2020-05-23 LAB — CBC WITH DIFFERENTIAL/PLATELET
Basophils Absolute: 0 10*3/uL (ref 0.0–0.2)
Basos: 0 %
EOS (ABSOLUTE): 0 10*3/uL (ref 0.0–0.4)
Eos: 0 %
Hematocrit: 44.7 % (ref 37.5–51.0)
Hemoglobin: 15.2 g/dL (ref 13.0–17.7)
Immature Grans (Abs): 0 10*3/uL (ref 0.0–0.1)
Immature Granulocytes: 0 %
Lymphocytes Absolute: 1.3 10*3/uL (ref 0.7–3.1)
Lymphs: 17 %
MCH: 30.3 pg (ref 26.6–33.0)
MCHC: 34 g/dL (ref 31.5–35.7)
MCV: 89 fL (ref 79–97)
Monocytes Absolute: 0.5 10*3/uL (ref 0.1–0.9)
Monocytes: 6 %
Neutrophils Absolute: 5.7 10*3/uL (ref 1.4–7.0)
Neutrophils: 77 %
Platelets: 254 10*3/uL (ref 150–450)
RBC: 5.01 x10E6/uL (ref 4.14–5.80)
RDW: 13 % (ref 11.6–15.4)
WBC: 7.5 10*3/uL (ref 3.4–10.8)

## 2020-05-23 LAB — COMPREHENSIVE METABOLIC PANEL
ALT: 24 IU/L (ref 0–44)
AST: 21 IU/L (ref 0–40)
Albumin/Globulin Ratio: 2.1 (ref 1.2–2.2)
Albumin: 4.8 g/dL (ref 3.8–4.9)
Alkaline Phosphatase: 67 IU/L (ref 44–121)
BUN/Creatinine Ratio: 11 (ref 9–20)
BUN: 13 mg/dL (ref 6–24)
Bilirubin Total: 0.8 mg/dL (ref 0.0–1.2)
CO2: 23 mmol/L (ref 20–29)
Calcium: 9.8 mg/dL (ref 8.7–10.2)
Chloride: 104 mmol/L (ref 96–106)
Creatinine, Ser: 1.15 mg/dL (ref 0.76–1.27)
GFR calc Af Amer: 84 mL/min/{1.73_m2} (ref >59)
GFR calc non Af Amer: 73 mL/min/{1.73_m2} (ref >59)
Globulin, Total: 2.3 g/dL (ref 1.5–4.5)
Glucose: 92 mg/dL (ref 65–99)
Potassium: 4.5 mmol/L (ref 3.5–5.2)
Sodium: 143 mmol/L (ref 134–144)
Total Protein: 7.1 g/dL (ref 6.0–8.5)

## 2020-05-23 LAB — HEMOGLOBIN A1C
Est. average glucose Bld gHb Est-mCnc: 126 mg/dL
Hgb A1c MFr Bld: 6 % — ABNORMAL HIGH (ref 4.8–5.6)

## 2020-05-23 LAB — LIPID PANEL W/O CHOL/HDL RATIO
Cholesterol, Total: 197 mg/dL (ref 100–199)
HDL: 53 mg/dL (ref >39)
LDL Chol Calc (NIH): 123 mg/dL — ABNORMAL HIGH (ref 0–99)
Triglycerides: 116 mg/dL (ref 0–149)
VLDL Cholesterol Cal: 21 mg/dL (ref 5–40)

## 2020-05-23 LAB — TSH: TSH: 0.792 u[IU]/mL (ref 0.450–4.500)

## 2020-05-23 LAB — PSA: Prostate Specific Ag, Serum: 0.8 ng/mL (ref 0.0–4.0)

## 2020-05-23 NOTE — Progress Notes (Signed)
Contacted via MyChart The 10-year ASCVD risk score Denman George DC Jr., et al., 2013) is: 9.3%   Values used to calculate the score:     Age: 54 years     Sex: Male     Is Non-Hispanic African American: Yes     Diabetic: No     Tobacco smoker: No     Systolic Blood Pressure: 127 mmHg     Is BP treated: Yes     HDL Cholesterol: 53 mg/dL     Total Cholesterol: 197 mg/dL Good afternoon Tamarick, your labs have returned.  Overall they are remaining stable, diabetes testing shows A1C continuing in prediabetes range at 6%, no change from previous.  We will continue to monitor and recommend focus on diet and regular exercise.  Your LDL is above normal. The LDL is the bad cholesterol. Over time and in combination with inflammation and other factors, this contributes to plaque which in turn may lead to stroke and/or heart attack down the road. Sometimes high LDL is primarily genetic, and people might be eating all the right foods but still have high numbers. Other times, there is room for improvement in one's diet and eating healthier can bring this number down and potentially reduce one's risk of heart attack and/or stroke.   To reduce your LDL, Remember - more fruits and vegetables, more fish, and limit red meat and dairy products. More soy, nuts, beans, barley, lentils, oats and plant sterol ester enriched margarine instead of butter. I also encourage eliminating sugar and processed food. Remember, shop on the outside of the grocery store and visit your [employer]. If you would like to talk with me about dietary changes for your cholesterol, please let me know. We should recheck your cholesterol in 6 months. Keep being awesome!!  Thank you for allowing me to participate in your care. Kindest regards, Paden Kuras

## 2020-11-17 ENCOUNTER — Encounter: Payer: Self-pay | Admitting: Nurse Practitioner

## 2020-11-17 DIAGNOSIS — E7800 Pure hypercholesterolemia, unspecified: Secondary | ICD-10-CM | POA: Insufficient documentation

## 2020-11-20 ENCOUNTER — Ambulatory Visit (INDEPENDENT_AMBULATORY_CARE_PROVIDER_SITE_OTHER): Payer: BC Managed Care – PPO | Admitting: Nurse Practitioner

## 2020-11-20 ENCOUNTER — Other Ambulatory Visit: Payer: Self-pay

## 2020-11-20 ENCOUNTER — Encounter: Payer: Self-pay | Admitting: Nurse Practitioner

## 2020-11-20 VITALS — BP 125/87 | HR 69 | Temp 98.3°F | Wt 213.8 lb

## 2020-11-20 DIAGNOSIS — I10 Essential (primary) hypertension: Secondary | ICD-10-CM

## 2020-11-20 DIAGNOSIS — E559 Vitamin D deficiency, unspecified: Secondary | ICD-10-CM

## 2020-11-20 DIAGNOSIS — E7800 Pure hypercholesterolemia, unspecified: Secondary | ICD-10-CM | POA: Diagnosis not present

## 2020-11-20 DIAGNOSIS — R7303 Prediabetes: Secondary | ICD-10-CM

## 2020-11-20 LAB — MICROALBUMIN, URINE WAIVED
Creatinine, Urine Waived: 300 mg/dL (ref 10–300)
Microalb, Ur Waived: 30 mg/L — ABNORMAL HIGH (ref 0–19)
Microalb/Creat Ratio: <30 mg/g (ref <30)

## 2020-11-20 LAB — BAYER DCA HB A1C WAIVED: HB A1C (BAYER DCA - WAIVED): 5.8 % (ref <7.0)

## 2020-11-20 NOTE — Assessment & Plan Note (Signed)
Chronic, stable with BP at goal in office and on home readings.  Continue current medication regimen and adjust as needed.  DASH diet focus.  Refills sent up to date.  Return in October as scheduled for physical and labs.

## 2020-11-20 NOTE — Patient Instructions (Signed)

## 2020-11-20 NOTE — Assessment & Plan Note (Signed)
Ongoing with A1c 5.8% today, some improvement.  Urine ALB 30, continue Benazepril for kidney protection.  Continue diet focus at this time. Return in October as scheduled for physical and labs.

## 2020-11-20 NOTE — Progress Notes (Signed)
BP 125/87   Pulse 69   Temp 98.3 F (36.8 C) (Oral)   Wt 213 lb 12.8 oz (97 kg)   SpO2 96%   BMI 30.68 kg/m    Subjective:    Patient ID: Harold [name], male    DOB: Oct 09, 1965, 55 y.o.   MRN: [id number]  HPI: Harold [name] is a 55 y.o. male  Chief Complaint  Patient presents with  . Hypertension  . Prediabetic    Patient denies having any questions or concerns at today's visit.   HYPERTENSION Continues on Amlodipine, Metoprolol, and Benazepril. Hypertension status: stable  Satisfied with current treatment? yes Duration of hypertension: chronic BP monitoring frequency:  occasionally BP range: on average 120/70 range BP medication side effects:  no Medication compliance: good compliance Aspirin: no Recurrent headaches: no Visual changes: no Palpitations: no Dyspnea: no Chest pain: no Lower extremity edema: no Dizzy/lightheaded: no  The 10-year ASCVD risk score Denman George DC Jr., et al., 2013) is: 9%   Values used to calculate the score:     Age: 21 years     Sex: Male     Is Non-Hispanic African American: Yes     Diabetic: No     Tobacco smoker: No     Systolic Blood Pressure: 125 mmHg     Is BP treated: Yes     HDL Cholesterol: 53 mg/dL     Total Cholesterol: 197 mg/dL  PREDIABETES: Labs noting glucose above 100, last A1C 6% in October 2021. Denies polyuria, polyphagia, polydipsia.  Vitamin D level 18.7 last visit in July 2020 and was prescribed supplement.  Is not taking supplement.  Relevant past medical, surgical, family and social history reviewed and updated as indicated. Interim medical history since our last visit reviewed. Allergies and medications reviewed and updated.  Review of Systems  Constitutional: Negative for activity change, diaphoresis, fatigue and fever.  Respiratory: Negative for cough, chest tightness, shortness of breath and wheezing.   Cardiovascular: Negative for chest pain, palpitations and leg swelling.   Gastrointestinal: Negative.   Endocrine: Negative for polydipsia, polyphagia and polyuria.  Neurological: Negative.   Psychiatric/Behavioral: Negative.     Per HPI unless specifically indicated above     Objective:    BP 125/87   Pulse 69   Temp 98.3 F (36.8 C) (Oral)   Wt 213 lb 12.8 oz (97 kg)   SpO2 96%   BMI 30.68 kg/m   Wt Readings from Last 3 Encounters:  11/20/20 213 lb 12.8 oz (97 kg)  05/22/20 214 lb (97.1 kg)  04/27/20 218 lb (98.9 kg)    Physical Exam Vitals and nursing note reviewed.  Constitutional:      General: He is awake.     Appearance: He is well-developed and well-groomed. He is obese.  HENT:     Head: Normocephalic and atraumatic.     Right Ear: Hearing normal. No drainage.     Left Ear: Hearing normal. No drainage.     Mouth/Throat:     Pharynx: Uvula midline.  Eyes:     General: Lids are normal.        Right eye: No discharge.        Left eye: No discharge.     Conjunctiva/sclera: Conjunctivae normal.     Pupils: Pupils are equal, round, and reactive to light.  Neck:     Thyroid: No thyromegaly.     Vascular: No carotid bruit or JVD.     Trachea:  Trachea normal.  Cardiovascular:     Rate and Rhythm: Normal rate and regular rhythm.     Heart sounds: Normal heart sounds, S1 normal and S2 normal. No murmur heard. No gallop.   Pulmonary:     Effort: Pulmonary effort is normal.     Breath sounds: Normal breath sounds.  Abdominal:     General: Bowel sounds are normal.     Palpations: Abdomen is soft. There is no hepatomegaly or splenomegaly.  Musculoskeletal:        General: Normal range of motion.     Cervical back: Normal range of motion and neck supple.     Right lower leg: No edema.     Left lower leg: No edema.  Skin:    General: Skin is warm and dry.     Capillary Refill: Capillary refill takes less than 2 seconds.     Findings: No rash.  Neurological:     Mental Status: He is alert and oriented to person, place, and time.      Deep Tendon Reflexes: Reflexes are normal and symmetric.  Psychiatric:        Mood and Affect: Mood normal.        Behavior: Behavior normal. Behavior is cooperative.        Thought Content: Thought content normal.        Judgment: Judgment normal.    Results for orders placed or performed in visit on 05/22/20  CBC with Differential/Platelet  Result Value Ref Range   WBC 7.5 3.4 - 10.8 x10E3/uL   RBC 5.01 4.14 - 5.80 x10E6/uL   Hemoglobin 15.2 13.0 - 17.7 g/dL   Hematocrit 65.9 93.5 - 51.0 %   MCV 89 79 - 97 fL   MCH 30.3 26.6 - 33.0 pg   MCHC 34.0 31.5 - 35.7 g/dL   RDW 70.1 77.9 - 39.0 %   Platelets 254 150 - 450 x10E3/uL   Neutrophils 77 Not Estab. %   Lymphs 17 Not Estab. %   Monocytes 6 Not Estab. %   Eos 0 Not Estab. %   Basos 0 Not Estab. %   Neutrophils Absolute 5.7 1.4 - 7.0 x10E3/uL   Lymphocytes Absolute 1.3 0.7 - 3.1 x10E3/uL   Monocytes Absolute 0.5 0.1 - 0.9 x10E3/uL   EOS (ABSOLUTE) 0.0 0.0 - 0.4 x10E3/uL   Basophils Absolute 0.0 0.0 - 0.2 x10E3/uL   Immature Granulocytes 0 Not Estab. %   Immature Grans (Abs) 0.0 0.0 - 0.1 x10E3/uL  Comprehensive metabolic panel  Result Value Ref Range   Glucose 92 65 - 99 mg/dL   BUN 13 6 - 24 mg/dL   Creatinine, Ser 3.00 0.76 - 1.27 mg/dL   GFR calc non Af Amer 73 >59 mL/min/1.73   GFR calc Af Amer 84 >59 mL/min/1.73   BUN/Creatinine Ratio 11 9 - 20   Sodium 143 134 - 144 mmol/L   Potassium 4.5 3.5 - 5.2 mmol/L   Chloride 104 96 - 106 mmol/L   CO2 23 20 - 29 mmol/L   Calcium 9.8 8.7 - 10.2 mg/dL   Total Protein 7.1 6.0 - 8.5 g/dL   Albumin 4.8 3.8 - 4.9 g/dL   Globulin, Total 2.3 1.5 - 4.5 g/dL   Albumin/Globulin Ratio 2.1 1.2 - 2.2   Bilirubin Total 0.8 0.0 - 1.2 mg/dL   Alkaline Phosphatase 67 44 - 121 IU/L   AST 21 0 - 40 IU/L   ALT 24 0 - 44 IU/L  Lipid  Panel w/o Chol/HDL Ratio  Result Value Ref Range   Cholesterol, Total 197 100 - 199 mg/dL   Triglycerides 102 0 - 149 mg/dL   HDL 53 >58 mg/dL   VLDL  Cholesterol Cal 21 5 - 40 mg/dL   LDL Chol Calc (NIH) 527 (H) 0 - 99 mg/dL  TSH  Result Value Ref Range   TSH 0.792 0.450 - 4.500 uIU/mL  PSA  Result Value Ref Range   Prostate Specific Ag, Serum 0.8 0.0 - 4.0 ng/mL  HgB A1c  Result Value Ref Range   Hgb A1c MFr Bld 6.0 (H) 4.8 - 5.6 %   Est. average glucose Bld gHb Est-mCnc 126 mg/dL      Assessment & Plan:   Problem List Items Addressed This Visit      Cardiovascular and Mediastinum   Essential hypertension - Primary    Chronic, stable with BP at goal in office and on home readings.  Continue current medication regimen and adjust as needed.  DASH diet focus.  Refills sent up to date.  Return in October as scheduled for physical and labs.      Relevant Orders   Microalbumin, Urine Waived   Comprehensive metabolic panel     Other   Vitamin D deficiency    Ongoing, recommend taking Vitamin D3 2000 units daily.  Recheck level today.  Return in October as scheduled for physical and labs.      Relevant Orders   VITAMIN D 25 Hydroxy (Vit-D Deficiency, Fractures)   Pre-diabetes    Ongoing with A1c 5.8% today, some improvement.  Urine ALB 30, continue Benazepril for kidney protection.  Continue diet focus at this time. Return in October as scheduled for physical and labs.      Relevant Orders   Bayer DCA Hb A1c Waived   Microalbumin, Urine Waived   Elevated low density lipoprotein (LDL) cholesterol level    Ongoing, at this time continue diet focus.  Last LDL 123 and current ASCVD 9%.  Discussed at length with him calculations and guidelines, he is aware in future he may benefit from statin.      Relevant Orders   Lipid Panel w/o Chol/HDL Ratio   Comprehensive metabolic panel       Follow up plan: Return in about 6 months (around 05/23/2021) for Annual physical.

## 2020-11-20 NOTE — Assessment & Plan Note (Addendum)
Ongoing, recommend taking Vitamin D3 2000 units daily.  Recheck level today.  Return in October as scheduled for physical and labs.

## 2020-11-20 NOTE — Assessment & Plan Note (Signed)
Ongoing, at this time continue diet focus.  Last LDL 123 and current ASCVD 9%.  Discussed at length with him calculations and guidelines, he is aware in future he may benefit from statin.

## 2020-11-21 LAB — LIPID PANEL W/O CHOL/HDL RATIO
Cholesterol, Total: 241 mg/dL — ABNORMAL HIGH (ref 100–199)
HDL: 62 mg/dL (ref >39)
LDL Chol Calc (NIH): 160 mg/dL — ABNORMAL HIGH (ref 0–99)
Triglycerides: 110 mg/dL (ref 0–149)
VLDL Cholesterol Cal: 19 mg/dL (ref 5–40)

## 2020-11-21 LAB — COMPREHENSIVE METABOLIC PANEL
ALT: 23 IU/L (ref 0–44)
AST: 28 IU/L (ref 0–40)
Albumin/Globulin Ratio: 1.8 (ref 1.2–2.2)
Albumin: 5.1 g/dL — ABNORMAL HIGH (ref 3.8–4.9)
Alkaline Phosphatase: 64 IU/L (ref 44–121)
BUN/Creatinine Ratio: 14 (ref 9–20)
BUN: 18 mg/dL (ref 6–24)
Bilirubin Total: 0.9 mg/dL (ref 0.0–1.2)
CO2: 19 mmol/L — ABNORMAL LOW (ref 20–29)
Calcium: 10.5 mg/dL — ABNORMAL HIGH (ref 8.7–10.2)
Chloride: 102 mmol/L (ref 96–106)
Creatinine, Ser: 1.31 mg/dL — ABNORMAL HIGH (ref 0.76–1.27)
Globulin, Total: 2.9 g/dL (ref 1.5–4.5)
Glucose: 90 mg/dL (ref 65–99)
Potassium: 4.6 mmol/L (ref 3.5–5.2)
Sodium: 143 mmol/L (ref 134–144)
Total Protein: 8 g/dL (ref 6.0–8.5)
eGFR: 65 mL/min/{1.73_m2} (ref >59)

## 2020-11-21 LAB — VITAMIN D 25 HYDROXY (VIT D DEFICIENCY, FRACTURES): Vit D, 25-Hydroxy: 29.6 ng/mL — ABNORMAL LOW (ref 30.0–100.0)

## 2020-11-21 NOTE — Progress Notes (Signed)
Contacted via MyChart The 10-year ASCVD risk score Denman George DC Jr., et al., 2013) is: 9.1%   Values used to calculate the score:     Age: 55 years     Sex: Male     Is Non-Hispanic African American: Yes     Diabetic: No     Tobacco smoker: No     Systolic Blood Pressure: 125 mmHg     Is BP treated: Yes     HDL Cholesterol: 62 mg/dL     Total Cholesterol: 241 mg/dL   Good evening Harold [name], overall labs are stable with exception of cholesterol levels. Creatinine was a little elevated, but you reported working out prior to visit, so this may be cause.  We will recheck next visit.  Vitamin D remains a little low, recommend you take Vitamin D3 2000 units daily which is good for bone and muscle health.  Your cholesterol levels are creeping up and with you high blood pressure + being a male you are at higher risk for stroke and heart attack.  At this time I do recommend starting a low dose statin, we can start low and increase as needed.  Are you okay with this?  If so I will send in and then would want you to schedule to come back to office in 6 weeks for visit and lab check.  The main side effects of statins are muscle aches and fatigue, if presented I would want you to let us know ASAP.  Questions?  Let me know about statin and I will send in if okay with starting. Keep being awesome!!  Thank you for allowing me to participate in your care. Kindest regards, Kyler Lerette

## 2021-05-09 ENCOUNTER — Encounter: Payer: Self-pay | Admitting: Internal Medicine

## 2021-05-09 ENCOUNTER — Telehealth (INDEPENDENT_AMBULATORY_CARE_PROVIDER_SITE_OTHER): Payer: BC Managed Care – PPO | Admitting: Internal Medicine

## 2021-05-09 DIAGNOSIS — R0789 Other chest pain: Secondary | ICD-10-CM | POA: Diagnosis not present

## 2021-05-09 MED ORDER — FEXOFENADINE HCL 180 MG PO TABS: 180.0000 mg | ORAL_TABLET | Freq: Every day | ORAL | 1 refills | Status: DC

## 2021-05-09 NOTE — Progress Notes (Signed)
There were no vitals taken for this visit.   Subjective:    Patient ID: Harold [name], male    DOB: 1966/03/30, 55 y.o.   MRN: [id number]  No chief complaint on file.   HPI: Harold [name] is a 55 y.o. male   This visit was completed via telephone due to the restrictions of the COVID-19 pandemic. All issues as above were discussed and addressed but no physical exam was performed. If it was felt that the patient should be evaluated in the office, they were directed there. The patient verbally consented to this visit. Patient was unable to complete an audio/visual visit due to Technical difficulties. Due to the catastrophic nature of the COVID-19 pandemic, this visit was done through audio contact only. Location of the patient: home Location of the provider: work Those involved with this call:  Provider: Charlynne Cousins, MD CMA: Yvonna Alanis, [place] Desk/Registration: Barth Kirks  Time spent on call: 10 minutes on the phone discussing health concers. 10 minutes total spent in review of patient's record and preparation of their chart.  Contracted COVID last week while on vacation, had symptoms last Monday took test yesterday - was a home test and PCR@ [facility] . Feeling better now  over the weekend.   Has had some heartburn in his chest occassionally chest area In centre of chest x painful per pt comes and goes. No tightness in chest per pt, does a morning run first day of the week is the worst and worse with increasing heart rate but gets better as he runs.     URI  This is a new problem. The current episode started in the past 7 days. There has been no fever. The fever has been present for 1 to 2 days. Associated symptoms include chest pain, congestion, coughing, headaches and sinus pain. Pertinent negatives include no abdominal pain, diarrhea, dysuria, ear pain, joint pain, joint swelling, nausea, neck pain, plugged ear sensation, rash, rhinorrhea, sneezing, sore  throat, swollen glands, vomiting or wheezing. Associated symptoms comments: Body aches noted per pt, has had dizziness today and has a temp of 99 F .  Chest Pain  This is a new problem. The current episode started more than 1 month ago. The problem has been gradually worsening. The pain is present in the substernal region. The pain is at a severity of 6/10. Associated symptoms include a cough and headaches. Pertinent negatives include no abdominal pain, nausea or vomiting.   No chief complaint on file.   Relevant past medical, surgical, family and social history reviewed and updated as indicated. Interim medical history since our last visit reviewed. Allergies and medications reviewed and updated.  Review of Systems  HENT:  Positive for congestion and sinus pain. Negative for ear pain, rhinorrhea, sneezing and sore throat.   Respiratory:  Positive for cough. Negative for wheezing.   Cardiovascular:  Positive for chest pain.  Gastrointestinal:  Negative for abdominal pain, diarrhea, nausea and vomiting.  Genitourinary:  Negative for dysuria.  Musculoskeletal:  Negative for joint pain and neck pain.  Skin:  Negative for rash.  Neurological:  Positive for headaches.   Per HPI unless specifically indicated above     Objective:    There were no vitals taken for this visit.  Wt Readings from Last 3 Encounters:  11/20/20 213 lb 12.8 oz (97 kg)  05/22/20 214 lb (97.1 kg)  04/27/20 218 lb (98.9 kg)    Physical Exam  Unable to  peform sec to virtual visit.   Results for orders placed or performed in visit on 11/20/20  Bayer DCA Hb A1c Waived  Result Value Ref Range   HB A1C (BAYER DCA - WAIVED) 5.8 <7.0 %  Microalbumin, Urine Waived  Result Value Ref Range   Microalb, Ur Waived 30 (H) 0 - 19 mg/L   Creatinine, Urine Waived 300 10 - 300 mg/dL   Microalb/Creat Ratio <30 <30 mg/g  Lipid Panel w/o Chol/HDL Ratio  Result Value Ref Range   Cholesterol, Total 241 (H) 100 - 199 mg/dL    Triglycerides 110 0 - 149 mg/dL   HDL 62 >39 mg/dL   VLDL Cholesterol Cal 19 5 - 40 mg/dL   LDL Chol Calc (NIH) 160 (H) 0 - 99 mg/dL  Comprehensive metabolic panel  Result Value Ref Range   Glucose 90 65 - 99 mg/dL   BUN 18 6 - 24 mg/dL   Creatinine, Ser 1.31 (H) 0.76 - 1.27 mg/dL   eGFR 65 >59 mL/min/1.73   BUN/Creatinine Ratio 14 9 - 20   Sodium 143 134 - 144 mmol/L   Potassium 4.6 3.5 - 5.2 mmol/L   Chloride 102 96 - 106 mmol/L   CO2 19 (L) 20 - 29 mmol/L   Calcium 10.5 (H) 8.7 - 10.2 mg/dL   Total Protein 8.0 6.0 - 8.5 g/dL   Albumin 5.1 (H) 3.8 - 4.9 g/dL   Globulin, Total 2.9 1.5 - 4.5 g/dL   Albumin/Globulin Ratio 1.8 1.2 - 2.2   Bilirubin Total 0.9 0.0 - 1.2 mg/dL   Alkaline Phosphatase 64 44 - 121 IU/L   AST 28 0 - 40 IU/L   ALT 23 0 - 44 IU/L  VITAMIN D 25 Hydroxy (Vit-D Deficiency, Fractures)  Result Value Ref Range   Vit D, 25-Hydroxy 29.6 (L) 30.0 - 100.0 ng/mL        Current Outpatient Medications:    amLODipine (NORVASC) 10 MG tablet, Take 1 tablet (10 mg total) by mouth daily., Disp: 90 tablet, Rfl: 4   benazepril (LOTENSIN) 40 MG tablet, Take 1 tablet (40 mg total) by mouth daily., Disp: 90 tablet, Rfl: 4   metoprolol succinate (TOPROL-XL) 50 MG 24 hr tablet, Take 1 tablet (50 mg total) by mouth daily. Take with or immediately following a meal., Disp: 90 tablet, Rfl: 4    Assessment & Plan:  COVID  positive : out of window for antivirals. Is doing advil and  nyquil. Increase fluid intake.  Headahce - tyelnol every 4-6 hrs prn and alternate this with ibubrufen 800 mg q 8 hrly. Sinus pressure: use steam inhalation.  OTC -  Allegra / claritin.  5 days quarantine.  Ok to rtw in 5 days if tests -ve follow Pt will need to come in wait in the car and get swabbed for flu and COVID test today . Pt verbalized understanding of such, to get to the office at today and get a curb side test for the above.  2. Chest pain : not currently. Will refer cardiology. Has an  appt in 2 weeks. Advised to call the office or go to the ER if she develops chest pain , any new onset of bleeding / black stools or  fresh red blood from any orifice,  shortness of breath dizziness or tingling or numbness.  Pt verbalized understanding of such.   Problem List Items Addressed This Visit   None    Orders Placed This Encounter  Procedures   Ambulatory  referral to Cardiology      Meds ordered this encounter  Medications   fexofenadine (ALLEGRA ALLERGY) 180 MG tablet    Sig: Take 1 tablet (180 mg total) by mouth daily.    Dispense:  10 tablet    Refill:  1      Follow up plan: No follow-ups on file.

## 2021-05-30 ENCOUNTER — Ambulatory Visit: Payer: BC Managed Care – PPO | Admitting: Internal Medicine

## 2021-06-15 ENCOUNTER — Other Ambulatory Visit: Payer: Self-pay | Admitting: Nurse Practitioner

## 2021-06-15 DIAGNOSIS — I10 Essential (primary) hypertension: Secondary | ICD-10-CM

## 2021-06-16 NOTE — Telephone Encounter (Signed)
last RF 04/27/20 for 15 months for both meds

## 2021-06-16 NOTE — Telephone Encounter (Signed)
Pt should have ample supply. LRFs 04/27/20  #90  4 refills

## 2021-06-20 ENCOUNTER — Ambulatory Visit (INDEPENDENT_AMBULATORY_CARE_PROVIDER_SITE_OTHER): Payer: BC Managed Care – PPO | Admitting: Internal Medicine

## 2021-06-20 ENCOUNTER — Encounter: Payer: Self-pay | Admitting: Internal Medicine

## 2021-06-20 ENCOUNTER — Other Ambulatory Visit: Payer: Self-pay

## 2021-06-20 VITALS — BP 101/73 | HR 60 | Temp 97.7°F | Ht 70.08 in | Wt 215.4 lb

## 2021-06-20 DIAGNOSIS — R079 Chest pain, unspecified: Secondary | ICD-10-CM | POA: Insufficient documentation

## 2021-06-20 DIAGNOSIS — E785 Hyperlipidemia, unspecified: Secondary | ICD-10-CM

## 2021-06-20 DIAGNOSIS — I10 Essential (primary) hypertension: Secondary | ICD-10-CM | POA: Diagnosis not present

## 2021-06-20 DIAGNOSIS — E782 Mixed hyperlipidemia: Secondary | ICD-10-CM | POA: Insufficient documentation

## 2021-06-20 MED ORDER — AMLODIPINE BESYLATE 10 MG PO TABS: 10.0000 mg | ORAL_TABLET | Freq: Every day | ORAL | 4 refills | Status: DC

## 2021-06-20 MED ORDER — BENAZEPRIL HCL 40 MG PO TABS: 40.0000 mg | ORAL_TABLET | Freq: Every day | ORAL | 4 refills | Status: DC

## 2021-06-20 MED ORDER — ASPIRIN 81 MG PO TBEC
81.0000 mg | DELAYED_RELEASE_TABLET | Freq: Every day | ORAL | 12 refills | Status: DC
Start: 2021-06-20 — End: 2021-08-01

## 2021-06-20 MED ORDER — NITROGLYCERIN 0.4 MG SL SUBL: 0.4000 mg | SUBLINGUAL_TABLET | SUBLINGUAL | 3 refills | Status: DC | PRN

## 2021-06-20 MED ORDER — METOPROLOL SUCCINATE ER 50 MG PO TB24: 50.0000 mg | ORAL_TABLET | Freq: Every day | ORAL | 4 refills | Status: DC

## 2021-06-20 NOTE — Progress Notes (Signed)
BP 101/73   Pulse 60   Temp 97.7 F (36.5 C) (Oral)   Ht 5' 10.08" (1.78 m)   Wt 215 lb 6.4 oz (97.7 kg)   SpO2 98%   BMI 30.84 kg/m    Subjective:    Patient ID: Harold [name], male    DOB: [birth year], 55 y.o.   MRN: [id number]  Chief Complaint  Patient presents with   Hypertension    HPI: Harold [name] is a 55 y.o. male  In a episode of CP - worst pain only 2 - 3 of such had one a couple weeks ago - was running and had to stop running. Denies SOB, was sweating already from exercise.   Minor aches are more common. Has had sweating on Sunday and Monday without exercise.   Hypertension This is a chronic problem. Associated symptoms include chest pain.  Chest Pain  This is a chronic (worsens with excercise, hits for a moment and subsides, started 7 - 8 months ago. hasnt been seen since then. happens everytime he excercises.) problem. The current episode started more than 1 month ago (best description   feels like his heart is punching outwards and goes along).  His past medical history is significant for hypertension.   Chief Complaint  Patient presents with   Hypertension    Relevant past medical, surgical, family and social history reviewed and updated as indicated. Interim medical history since our last visit reviewed. Allergies and medications reviewed and updated.  Review of Systems  Cardiovascular:  Positive for chest pain.   Per HPI unless specifically indicated above     Objective:    BP 101/73   Pulse 60   Temp 97.7 F (36.5 C) (Oral)   Ht 5' 10.08" (1.78 m)   Wt 215 lb 6.4 oz (97.7 kg)   SpO2 98%   BMI 30.84 kg/m   Wt Readings from Last 3 Encounters:  06/20/21 215 lb 6.4 oz (97.7 kg)  11/20/20 213 lb 12.8 oz (97 kg)  05/22/20 214 lb (97.1 kg)    Physical Exam Vitals and nursing note reviewed.  Constitutional:      General: He is not in acute distress.    Appearance: Normal appearance. He is not ill-appearing or diaphoretic.   HENT:     Head: Normocephalic and atraumatic.     Right Ear: Tympanic membrane and external ear normal. There is no impacted cerumen.     Left Ear: External ear normal.     Nose: No congestion or rhinorrhea.     Mouth/Throat:     Pharynx: No oropharyngeal exudate or posterior oropharyngeal erythema.  Eyes:     Conjunctiva/sclera: Conjunctivae normal.     Pupils: Pupils are equal, round, and reactive to light.  Cardiovascular:     Rate and Rhythm: Normal rate and regular rhythm.     Heart sounds: No murmur heard.   No friction rub. No gallop.  Pulmonary:     Effort: No respiratory distress.     Breath sounds: No stridor. No wheezing or rhonchi.  Chest:     Chest wall: No tenderness.  Musculoskeletal:     Cervical back: Normal range of motion and neck supple. No rigidity or tenderness.     Left lower leg: No edema.  Skin:    General: Skin is warm and dry.  Neurological:     Mental Status: He is alert.     Cranial Nerves: No cranial nerve deficit.  Sensory: No sensory deficit.     Motor: No weakness.     Coordination: Coordination normal.    Results for orders placed or performed in visit on 11/20/20  Bayer DCA Hb A1c Waived  Result Value Ref Range   HB A1C (BAYER DCA - WAIVED) 5.8 <7.0 %  Microalbumin, Urine Waived  Result Value Ref Range   Microalb, Ur Waived 30 (H) 0 - 19 mg/L   Creatinine, Urine Waived 300 10 - 300 mg/dL   Microalb/Creat Ratio <30 <30 mg/g  Lipid Panel w/o Chol/HDL Ratio  Result Value Ref Range   Cholesterol, Total 241 (H) 100 - 199 mg/dL   Triglycerides 110 0 - 149 mg/dL   HDL 62 >39 mg/dL   VLDL Cholesterol Cal 19 5 - 40 mg/dL   LDL Chol Calc (NIH) 160 (H) 0 - 99 mg/dL  Comprehensive metabolic panel  Result Value Ref Range   Glucose 90 65 - 99 mg/dL   BUN 18 6 - 24 mg/dL   Creatinine, Ser 1.31 (H) 0.76 - 1.27 mg/dL   eGFR 65 >59 mL/min/1.73   BUN/Creatinine Ratio 14 9 - 20   Sodium 143 134 - 144 mmol/L   Potassium 4.6 3.5 - 5.2 mmol/L    Chloride 102 96 - 106 mmol/L   CO2 19 (L) 20 - 29 mmol/L   Calcium 10.5 (H) 8.7 - 10.2 mg/dL   Total Protein 8.0 6.0 - 8.5 g/dL   Albumin 5.1 (H) 3.8 - 4.9 g/dL   Globulin, Total 2.9 1.5 - 4.5 g/dL   Albumin/Globulin Ratio 1.8 1.2 - 2.2   Bilirubin Total 0.9 0.0 - 1.2 mg/dL   Alkaline Phosphatase 64 44 - 121 IU/L   AST 28 0 - 40 IU/L   ALT 23 0 - 44 IU/L  VITAMIN D 25 Hydroxy (Vit-D Deficiency, Fractures)  Result Value Ref Range   Vit D, 25-Hydroxy 29.6 (L) 30.0 - 100.0 ng/mL        Current Outpatient Medications:    amLODipine (NORVASC) 10 MG tablet, Take 1 tablet (10 mg total) by mouth daily., Disp: 90 tablet, Rfl: 4   benazepril (LOTENSIN) 40 MG tablet, Take 1 tablet (40 mg total) by mouth daily., Disp: 90 tablet, Rfl: 4   fexofenadine (ALLEGRA ALLERGY) 180 MG tablet, Take 1 tablet (180 mg total) by mouth daily. (Patient not taking: Reported on 06/20/2021), Disp: 10 tablet, Rfl: 1   metoprolol succinate (TOPROL-XL) 50 MG 24 hr tablet, Take 1 tablet (50 mg total) by mouth daily. Take with or immediately following a meal., Disp: 90 tablet, Rfl: 4    Assessment & Plan:  Chest pain on exertion:  Will need to check EKG Will need to add ASA 81 mg NG sent for sever cp To fu with cards asap Pt didn't call them back when referral was made in sept.  If EKG abnl - will need to have him see cards earlier than the 15hth of nove.  Chest pain emergency signs and symptoms of an emergency were d/w pt pt verbalised understnding. to go to the ER if he devlops worsening pain in the chest radiating to the left arm and jaw or abdomen / nausea/ vomiting/ diaphoresis .   HLD  LDL high at 160  recheck FLP, check LFT's work on diet, SE of meds explained to pt. low fat and high fiber diet explained to pt.   HTN is on toprol xl , norvasc 10 mg, benazepril 40 mg daily for such  HR  low at 53 ? Sec to 50 mg of toprol xl.  Continue current meds.  Medication compliance emphasised. pt advised to keep  Bp logs. Pt verbalised understanding of the same. Pt to have a low salt diet . Exercise to reach a goal of at least 150 mins a week.  lifestyle modifications explained and pt understands importance of the above.  Problem List Items Addressed This Visit       Cardiovascular and Mediastinum   Essential hypertension   Relevant Medications   amLODipine (NORVASC) 10 MG tablet   benazepril (LOTENSIN) 40 MG tablet   metoprolol succinate (TOPROL-XL) 50 MG 24 hr tablet     No orders of the defined types were placed in this encounter.    Meds ordered this encounter  Medications   amLODipine (NORVASC) 10 MG tablet    Sig: Take 1 tablet (10 mg total) by mouth daily.    Dispense:  90 tablet    Refill:  4   benazepril (LOTENSIN) 40 MG tablet    Sig: Take 1 tablet (40 mg total) by mouth daily.    Dispense:  90 tablet    Refill:  4    Must keep upcoming appt   metoprolol succinate (TOPROL-XL) 50 MG 24 hr tablet    Sig: Take 1 tablet (50 mg total) by mouth daily. Take with or immediately following a meal.    Dispense:  90 tablet    Refill:  4    Must keep upcoming appt for refills     Follow up plan: No follow-ups on file.

## 2021-06-21 LAB — CBC WITH DIFFERENTIAL/PLATELET
Basophils Absolute: 0 10*3/uL (ref 0.0–0.2)
Basos: 1 %
EOS (ABSOLUTE): 0 10*3/uL (ref 0.0–0.4)
Eos: 0 %
Hematocrit: 46.5 % (ref 37.5–51.0)
Hemoglobin: 15.6 g/dL (ref 13.0–17.7)
Immature Grans (Abs): 0 10*3/uL (ref 0.0–0.1)
Immature Granulocytes: 0 %
Lymphocytes Absolute: 1.8 10*3/uL (ref 0.7–3.1)
Lymphs: 22 %
MCH: 29.5 pg (ref 26.6–33.0)
MCHC: 33.5 g/dL (ref 31.5–35.7)
MCV: 88 fL (ref 79–97)
Monocytes Absolute: 0.6 10*3/uL (ref 0.1–0.9)
Monocytes: 7 %
Neutrophils Absolute: 5.9 10*3/uL (ref 1.4–7.0)
Neutrophils: 70 %
Platelets: 280 10*3/uL (ref 150–450)
RBC: 5.28 x10E6/uL (ref 4.14–5.80)
RDW: 13.2 % (ref 11.6–15.4)
WBC: 8.3 10*3/uL (ref 3.4–10.8)

## 2021-06-21 LAB — LIPID PANEL W/O CHOL/HDL RATIO
Cholesterol, Total: 245 mg/dL — ABNORMAL HIGH (ref 100–199)
HDL: 62 mg/dL (ref >39)
LDL Chol Calc (NIH): 157 mg/dL — ABNORMAL HIGH (ref 0–99)
Triglycerides: 144 mg/dL (ref 0–149)
VLDL Cholesterol Cal: 26 mg/dL (ref 5–40)

## 2021-06-21 LAB — COMPREHENSIVE METABOLIC PANEL
ALT: 38 IU/L (ref 0–44)
AST: 34 IU/L (ref 0–40)
Albumin/Globulin Ratio: 2.3 — ABNORMAL HIGH (ref 1.2–2.2)
Albumin: 5.2 g/dL — ABNORMAL HIGH (ref 3.8–4.9)
Alkaline Phosphatase: 67 IU/L (ref 44–121)
BUN/Creatinine Ratio: 13 (ref 9–20)
BUN: 17 mg/dL (ref 6–24)
Bilirubin Total: 0.8 mg/dL (ref 0.0–1.2)
CO2: 25 mmol/L (ref 20–29)
Calcium: 10.4 mg/dL — ABNORMAL HIGH (ref 8.7–10.2)
Chloride: 102 mmol/L (ref 96–106)
Creatinine, Ser: 1.32 mg/dL — ABNORMAL HIGH (ref 0.76–1.27)
Globulin, Total: 2.3 g/dL (ref 1.5–4.5)
Glucose: 89 mg/dL (ref 70–99)
Potassium: 4.7 mmol/L (ref 3.5–5.2)
Sodium: 141 mmol/L (ref 134–144)
Total Protein: 7.5 g/dL (ref 6.0–8.5)
eGFR: 64 mL/min/{1.73_m2} (ref >59)

## 2021-06-21 LAB — TSH: TSH: 1.19 u[IU]/mL (ref 0.450–4.500)

## 2021-07-02 ENCOUNTER — Encounter: Payer: Self-pay | Admitting: Cardiovascular Disease

## 2021-07-02 ENCOUNTER — Ambulatory Visit (INDEPENDENT_AMBULATORY_CARE_PROVIDER_SITE_OTHER): Payer: BC Managed Care – PPO | Admitting: Cardiovascular Disease

## 2021-07-02 ENCOUNTER — Other Ambulatory Visit: Payer: Self-pay

## 2021-07-02 VITALS — BP 110/80 | HR 60 | Ht 70.0 in | Wt 220.5 lb

## 2021-07-02 DIAGNOSIS — I209 Angina pectoris, unspecified: Secondary | ICD-10-CM

## 2021-07-02 DIAGNOSIS — R079 Chest pain, unspecified: Secondary | ICD-10-CM

## 2021-07-02 DIAGNOSIS — I10 Essential (primary) hypertension: Secondary | ICD-10-CM

## 2021-07-02 MED ORDER — METOPROLOL TARTRATE 50 MG PO TABS: 50.0000 mg | ORAL_TABLET | Freq: Once | ORAL | 0 refills | Status: DC

## 2021-07-02 NOTE — Progress Notes (Signed)
Cardiology Office Note  Date:  07/02/2021   ID:  Harold [name], DOB 1965-09-05, MRN [id number]  PCP:  Loura Pardon, MD   Chief Complaint  Patient presents with   New Patient (Initial Visit)    Ref by Dr. Charlotta Newton for intermittent chest pain. Medications reviewed by the patient verbally.     HPI:  Mr. Lafonda Mosses is a 55 year old gentleman with past medical history of Nonsmoker Nondiabetes Who presents by referral from Dr. Charlotta Newton for consultation of his chest pain  Has worsening chest pain for the past 7 to 8 months Describes 3 levels of chest pain First level chest pain present at rest Second low-grade chest burning sensation with low-grade exercise Third level of a heart rate chest pain has been more recent possibly since COVID 2 months ago, has only had several episodes but is intense typically brought on by exertion  Typically symptoms happen with exertion, rare at rest  Exercised today, had a little burning in the chest, denies any radiation to shoulders or neck Covid 2 months ago  EKG personally reviewed by myself on todays visit NSr rate 60 bpm no significant ST-T wave changes  Father died accident in 12s No other significant family history coronary disease  PMH:   has a past medical history of Hyperlipidemia and Hypertension.  PSH:    Past Surgical History:  Procedure Laterality Date   wisdom teeth removed      Current Outpatient Medications  Medication Sig Dispense Refill   amLODipine (NORVASC) 10 MG tablet Take 1 tablet (10 mg total) by mouth daily. 90 tablet 4   aspirin 81 MG EC tablet Take 1 tablet (81 mg total) by mouth daily. Swallow whole. 30 tablet 12   benazepril (LOTENSIN) 40 MG tablet Take 1 tablet (40 mg total) by mouth daily. 90 tablet 4   metoprolol succinate (TOPROL-XL) 50 MG 24 hr tablet Take 1 tablet (50 mg total) by mouth daily. Take with or immediately following a meal. 90 tablet 4   metoprolol tartrate (LOPRESSOR) 50 MG tablet Take 1  tablet (50 mg total) by mouth once for 1 dose. Take 2 hours before your CCTA procedure 1 tablet 0   nitroGLYCERIN (NITROSTAT) 0.4 MG SL tablet Place 1 tablet (0.4 mg total) under the tongue every 5 (five) minutes as needed for chest pain. 50 tablet 3   fexofenadine (ALLEGRA ALLERGY) 180 MG tablet Take 1 tablet (180 mg total) by mouth daily. (Patient not taking: No sig reported) 10 tablet 1   No current facility-administered medications for this visit.    Allergies:   Hct [hydrochlorothiazide]   Social History:  The patient  reports that he has never smoked. He has never used smokeless tobacco. He reports that he does not drink alcohol and does not use drugs.   Family History:   family history includes Alcohol abuse in his father; Hypertension in his father and mother; Parkinson's disease in his mother.    Review of Systems: Review of Systems  Constitutional: Negative.   HENT: Negative.    Respiratory: Negative.    Cardiovascular:  Positive for chest pain.  Gastrointestinal: Negative.   Musculoskeletal: Negative.   Neurological: Negative.   Psychiatric/Behavioral: Negative.    All other systems reviewed and are negative.   PHYSICAL EXAM: VS:  BP 110/80 (BP Location: Right Arm, Patient Position: Sitting, Cuff Size: Normal)   Pulse 60   Ht 5\' 10"  (1.778 m)   Wt 220 lb 8 oz (100 kg)  SpO2 98%   BMI 31.64 kg/m  , BMI Body mass index is 31.64 kg/m. GEN: Well nourished, well developed, in no acute distress HEENT: normal Neck: no JVD, carotid bruits, or masses Cardiac: RRR; no murmurs, rubs, or gallops,no edema  Respiratory:  clear to auscultation bilaterally, normal work of breathing GI: soft, nontender, nondistended, + BS MS: no deformity or atrophy Skin: warm and dry, no rash Neuro:  Strength and sensation are intact Psych: euthymic mood, full affect  Recent Labs: 06/20/2021: ALT 38; BUN 17; Creatinine, Ser 1.32; Hemoglobin 15.6; Platelets 280; Potassium 4.7; Sodium 141;  TSH 1.190    Lipid Panel Lab Results  Component Value Date   CHOL 245 (H) 06/20/2021   HDL 62 06/20/2021   LDLCALC 157 (H) 06/20/2021   TRIG 144 06/20/2021      Wt Readings from Last 3 Encounters:  07/02/21 220 lb 8 oz (100 kg)  06/20/21 215 lb 6.4 oz (97.7 kg)  11/20/20 213 lb 12.8 oz (97 kg)      ASSESSMENT AND PLAN:  Problem List Items Addressed This Visit       Cardiology Problems   Essential hypertension - Primary   Relevant Medications   metoprolol tartrate (LOPRESSOR) 50 MG tablet   Other Relevant Orders   EKG 12-Lead   CT CORONARY MORPH W/CTA COR W/SCORE W/CA W/CM &/OR WO/CM     Other   Chest pain in adult   Relevant Orders   EKG 12-Lead   CT CORONARY MORPH W/CTA COR W/SCORE W/CA W/CM &/OR WO/CM   Other Visit Diagnoses     Angina pectoris (HCC)       Relevant Medications   metoprolol tartrate (LOPRESSOR) 50 MG tablet   Other Relevant Orders   EKG 12-Lead   CT CORONARY MORPH W/CTA COR W/SCORE W/CA W/CM &/OR WO/CM      Angina/chest pain Worsening over the past 7 to 8 months, typically presents with treadmill exercise Has reached a new level of intensity in the past month or so with severe episodes -Stressed importance of evaluation of what sounds like angina with associated chest burning middle of his chest -We recommend cardiac CTA, this was ordered after long discussion We did discuss other options, nuclear stress test, treadmill, these to be less beneficial  Hyperlipidemia CTA above to help guide management, total cholesterol markedly elevated Family history unclear as father died in his 43s from accident Will likely need a statin  Essential hypertension Blood pressure is well controlled on today's visit. No changes made to the medications.     Total encounter time more than 60 minutes  Greater than 50% was spent in counseling and coordination of care with the patient  Patient seen in consultation for Dr. Charlotta Newton and be referred back to her  office for ongoing care of the issues detailed above  Signed, Dossie Arbour, M.D., Ph.D. [facility] [place], [place] [phone]

## 2021-07-02 NOTE — Patient Instructions (Signed)
Medication Instructions:  No changes  If you need a refill on your cardiac medications before your next appointment, please call your pharmacy.   Lab work: No new labs needed  Testing/Procedures: Cardiac CTA (We will call with results)  Follow-Up: At [facility], you and your health needs are our priority.  As part of our continuing mission to provide you with exceptional heart care, we have created designated Provider Care Teams.  These Care Teams include your primary Cardiologist (physician) and Advanced Practice Providers (APPs -  Physician Assistants and Nurse Practitioners) who all work together to provide you with the care you need, when you need it.  You will need a follow up appointment as needed  Providers on your designated Care Team:   Nicolasa Ducking, NP Eula Listen, PA-C Cadence Fransico Michael, [place]  COVID-19 Vaccine Information can be found at: [email or web address] For questions related to vaccine distribution or appointments, please email [email or web address] or call [phone].   Cardiac (coronary) CTA   [facility] [address] [place], [place] [address] [phone]  Date: 07/05/2021   Time: 2:00 PM Please arrive 15 mins early for check-in and test prep.  Please follow these instructions carefully:  Hold all erectile dysfunction medications at least 3 days (72 hrs) prior to test.  On the Night Before the Test: Be sure to Drink plenty of water. Do not consume any caffeinated/decaffeinated beverages or chocolate 12 hours prior to your test. This includes decaf as well Do not take any antihistamines 12 hours prior to your test. Such as Benadryl   On the Day of the Test: Drink plenty of water until 1 hour prior to the test. Do not eat any food 4 hours prior to the test. You may take your regular medications prior to the test.  Take metoprolol (Lopressor)  two hours prior to test. Lopressor 50 mg This was sent in to your pharmacy for pick-up HOLD Furosemide/Hydrochlorothiazide morning of the test. No fluid pills      After the Test: Drink plenty of water. After receiving IV contrast, you may experience a mild flushed feeling. This is normal. On occasion, you may experience a mild rash up to 24 hours after the test. This is not dangerous. If this occurs, you can take Benadryl 25 mg and increase your fluid intake (to flush out the kidneys) If you experience trouble breathing, this can be serious. If it is severe call 911 IMMEDIATELY. If it is mild, please call our office. If you take any of these medications:  Glipizide/Metformin Avandament,  Glucavance,  please do not take 48 hours after completing test unless otherwise instructed.   For scheduling needs, including cancellations and rescheduling, please call [place], [phone].

## 2021-07-04 ENCOUNTER — Telehealth (HOSPITAL_COMMUNITY): Payer: Self-pay | Admitting: Emergency Medicine

## 2021-07-04 NOTE — Telephone Encounter (Signed)
Attempted to call patient regarding upcoming cardiac CT appointment. °Left message on voicemail with name and callback number °Rhyan Wolters RN Navigator Cardiac Imaging °[facility] °[phone] Office °[phone] Cell ° °

## 2021-07-04 NOTE — Telephone Encounter (Signed)
Reaching out to patient to offer assistance regarding upcoming cardiac imaging study; pt verbalizes understanding of appt date/time, parking situation and where to check in, pre-test NPO status and medications ordered, and verified current allergies; name and call back number provided for further questions should they arise Rockwell Alexandria RN Navigator Cardiac Imaging [facility] [phone] office [phone] cell  Difficult IV Holding daily meds , taking both metoprolols Arrival time: 1:45p

## 2021-07-05 ENCOUNTER — Other Ambulatory Visit: Payer: Self-pay

## 2021-07-05 ENCOUNTER — Ambulatory Visit
Admission: RE | Admit: 2021-07-05 | Discharge: 2021-07-05 | Disposition: A | Discharge status: Home / Self Care | Payer: BC Managed Care – PPO | Source: Ambulatory Visit | Attending: Cardiovascular Disease | Admitting: Cardiovascular Disease

## 2021-07-05 DIAGNOSIS — R079 Chest pain, unspecified: Secondary | ICD-10-CM | POA: Insufficient documentation

## 2021-07-05 DIAGNOSIS — I10 Essential (primary) hypertension: Secondary | ICD-10-CM | POA: Diagnosis not present

## 2021-07-05 DIAGNOSIS — I209 Angina pectoris, unspecified: Secondary | ICD-10-CM | POA: Diagnosis not present

## 2021-07-05 MED ORDER — IOHEXOL 350 MG/ML SOLN
100.0000 mL | Freq: Once | INTRAVENOUS | Status: AC | PRN
  Administered 2021-07-05: 15:00:00 100 mL via INTRAVENOUS

## 2021-07-05 MED ORDER — NITROGLYCERIN 0.4 MG SL SUBL
0.8000 mg | SUBLINGUAL_TABLET | Freq: Once | SUBLINGUAL | Status: AC
  Administered 2021-07-05: 15:00:00 0.8 mg via SUBLINGUAL

## 2021-07-05 NOTE — Progress Notes (Signed)
Patient tolerated CT well. Drank water after. Vital signs stable encourage to drink water throughout day.Reasons explained and verbalized understanding. Ambulated steady gait.  

## 2021-07-16 ENCOUNTER — Telehealth: Payer: Self-pay

## 2021-07-16 NOTE — Telephone Encounter (Signed)
Able to reach back out to pt regarding his recent CCTA Dr. Mariah Milling had a chance to review his results and advised   "Cardiac CTA is normal  No significant coronary disease noted  Excellent study "  Mr. Harold [name] very thankful for the phone call of his results, all questions and concerns were address with nothing further at this time. Will see at next schedule f/u appt.

## 2021-07-16 NOTE — Telephone Encounter (Signed)
Attempted to reach out to pt, unable to make contact No DPR on file to leave results message [facility] [place]

## 2021-07-16 NOTE — Telephone Encounter (Signed)
Patient calling to discuss recent testing results  ° °Please call  ° °

## 2021-07-25 ENCOUNTER — Ambulatory Visit: Payer: BC Managed Care – PPO | Admitting: Internal Medicine

## 2021-08-01 ENCOUNTER — Encounter: Payer: Self-pay | Admitting: Internal Medicine

## 2021-08-01 ENCOUNTER — Ambulatory Visit (INDEPENDENT_AMBULATORY_CARE_PROVIDER_SITE_OTHER): Payer: BC Managed Care – PPO | Admitting: Internal Medicine

## 2021-08-01 ENCOUNTER — Other Ambulatory Visit: Payer: Self-pay

## 2021-08-01 VITALS — BP 106/72 | HR 70 | Temp 98.4°F | Ht 70.08 in | Wt 217.6 lb

## 2021-08-01 DIAGNOSIS — E785 Hyperlipidemia, unspecified: Secondary | ICD-10-CM

## 2021-08-01 DIAGNOSIS — R0789 Other chest pain: Secondary | ICD-10-CM

## 2021-08-01 MED ORDER — OMEPRAZOLE 20 MG PO CPDR: 20.0000 mg | DELAYED_RELEASE_CAPSULE | Freq: Every day | ORAL | 1 refills | Status: DC

## 2021-08-01 NOTE — Progress Notes (Signed)
BP 106/72    Pulse 70    Temp 98.4 F (36.9 C) (Oral)    Ht 5' 10.08" (1.78 m)    Wt 217 lb 9.6 oz (98.7 kg)    SpO2 100%    BMI 31.15 kg/m    Subjective:    Patient ID: Harold [name], male    DOB: June 20, 1966, 55 y.o.   MRN: [id number]  Chief Complaint  Patient presents with   Chest Pain    Patient states that he is still have the occasional chest pain from time to time. Currently not having chest pain. Follow up on Cards appt.    HPI: Harold [name] is a 55 y.o. male  Pt is here for a follow up had a CT calcium scan - normal - Cardiac CTA is normal . No significant coronary disease noted .   Chest Pain  Chronicity: atypical chest pain still happens as needed and denies anxiety.   Chief Complaint  Patient presents with   Chest Pain    Patient states that he is still have the occasional chest pain from time to time. Currently not having chest pain. Follow up on Cards appt.    Relevant past medical, surgical, family and social history reviewed and updated as indicated. Interim medical history since our last visit reviewed. Allergies and medications reviewed and updated.  Review of Systems  Cardiovascular:  Positive for chest pain.   Per HPI unless specifically indicated above     Objective:    BP 106/72    Pulse 70    Temp 98.4 F (36.9 C) (Oral)    Ht 5' 10.08" (1.78 m)    Wt 217 lb 9.6 oz (98.7 kg)    SpO2 100%    BMI 31.15 kg/m   Wt Readings from Last 3 Encounters:  08/01/21 217 lb 9.6 oz (98.7 kg)  07/02/21 220 lb 8 oz (100 kg)  06/20/21 215 lb 6.4 oz (97.7 kg)    Physical Exam Vitals and nursing note reviewed.  Constitutional:      General: He is not in acute distress.    Appearance: Normal appearance. He is not ill-appearing or diaphoretic.  HENT:     Head: Normocephalic and atraumatic.     Nose: No congestion or rhinorrhea.     Mouth/Throat:     Pharynx: No oropharyngeal exudate or posterior oropharyngeal erythema.  Eyes:      Conjunctiva/sclera: Conjunctivae normal.     Pupils: Pupils are equal, round, and reactive to light.  Cardiovascular:     Rate and Rhythm: Normal rate and regular rhythm.     Heart sounds: No murmur heard.   No friction rub. No gallop.  Pulmonary:     Effort: No respiratory distress.     Breath sounds: No stridor. No wheezing or rhonchi.  Chest:     Chest wall: No tenderness.  Abdominal:     General: Abdomen is flat. Bowel sounds are normal.     Palpations: Abdomen is soft. There is no mass.     Tenderness: There is no abdominal tenderness.  Musculoskeletal:     Left lower leg: No edema.  Skin:    General: Skin is warm and dry.  Neurological:     General: No focal deficit present.     Mental Status: He is alert and oriented to person, place, and time.  Psychiatric:        Mood and Affect: Mood normal.  Results for orders placed or performed in visit on 06/20/21  CBC with Differential/Platelet  Result Value Ref Range   WBC 8.3 3.4 - 10.8 x10E3/uL   RBC 5.28 4.14 - 5.80 x10E6/uL   Hemoglobin 15.6 13.0 - 17.7 g/dL   Hematocrit 46.5 37.5 - 51.0 %   MCV 88 79 - 97 fL   MCH 29.5 26.6 - 33.0 pg   MCHC 33.5 31.5 - 35.7 g/dL   RDW 13.2 11.6 - 15.4 %   Platelets 280 150 - 450 x10E3/uL   Neutrophils 70 Not Estab. %   Lymphs 22 Not Estab. %   Monocytes 7 Not Estab. %   Eos 0 Not Estab. %   Basos 1 Not Estab. %   Neutrophils Absolute 5.9 1.4 - 7.0 x10E3/uL   Lymphocytes Absolute 1.8 0.7 - 3.1 x10E3/uL   Monocytes Absolute 0.6 0.1 - 0.9 x10E3/uL   EOS (ABSOLUTE) 0.0 0.0 - 0.4 x10E3/uL   Basophils Absolute 0.0 0.0 - 0.2 x10E3/uL   Immature Granulocytes 0 Not Estab. %   Immature Grans (Abs) 0.0 0.0 - 0.1 x10E3/uL  Comprehensive metabolic panel  Result Value Ref Range   Glucose 89 70 - 99 mg/dL   BUN 17 6 - 24 mg/dL   Creatinine, Ser 1.32 (H) 0.76 - 1.27 mg/dL   eGFR 64 >59 mL/min/1.73   BUN/Creatinine Ratio 13 9 - 20   Sodium 141 134 - 144 mmol/L   Potassium 4.7 3.5 - 5.2  mmol/L   Chloride 102 96 - 106 mmol/L   CO2 25 20 - 29 mmol/L   Calcium 10.4 (H) 8.7 - 10.2 mg/dL   Total Protein 7.5 6.0 - 8.5 g/dL   Albumin 5.2 (H) 3.8 - 4.9 g/dL   Globulin, Total 2.3 1.5 - 4.5 g/dL   Albumin/Globulin Ratio 2.3 (H) 1.2 - 2.2   Bilirubin Total 0.8 0.0 - 1.2 mg/dL   Alkaline Phosphatase 67 44 - 121 IU/L   AST 34 0 - 40 IU/L   ALT 38 0 - 44 IU/L  Lipid Panel w/o Chol/HDL Ratio  Result Value Ref Range   Cholesterol, Total 245 (H) 100 - 199 mg/dL   Triglycerides 144 0 - 149 mg/dL   HDL 62 >39 mg/dL   VLDL Cholesterol Cal 26 5 - 40 mg/dL   LDL Chol Calc (NIH) 157 (H) 0 - 99 mg/dL  TSH  Result Value Ref Range   TSH 1.190 0.450 - 4.500 uIU/mL        Current Outpatient Medications:    amLODipine (NORVASC) 10 MG tablet, Take 1 tablet (10 mg total) by mouth daily., Disp: 90 tablet, Rfl: 4   benazepril (LOTENSIN) 40 MG tablet, Take 1 tablet (40 mg total) by mouth daily., Disp: 90 tablet, Rfl: 4   metoprolol succinate (TOPROL-XL) 50 MG 24 hr tablet, Take 1 tablet (50 mg total) by mouth daily. Take with or immediately following a meal., Disp: 90 tablet, Rfl: 4   omeprazole (PRILOSEC) 20 MG capsule, Take 1 capsule (20 mg total) by mouth daily., Disp: 30 capsule, Rfl: 1    Assessment & Plan:  Atypical chest pain > Sec to GERD Trial x PPI x 6 weeks  Normal Coronary CT including calcium scoring, per Cards and radiological rpeorting.   FINDINGS: Limited view of the lung parenchyma demonstrates no suspicious nodularity. Airways are normal.   Limited view of the mediastinum demonstrates no adenopathy. Esophagus normal.   Limited view of the upper abdomen unremarkable.   Limited view  of the skeleton and chest wall is unremarkable.   IMPRESSION: No significant extracardiac findings.  Normal coronary calcium score of 0. Patient is low risk for coronary events.   2. Normal coronary origin with right dominance.   3. No evidence of CAD.   4. CAD-RADS 0. Consider  non-atherosclerotic causes of chest pain HTN : is norvasc / benzapril / toprol xl 50 mg daily.  Continue current meds.  Medication compliance emphasised. pt advised to keep Bp logs. Pt verbalised understanding of the same. Pt to have a low salt diet . Exercise to reach a goal of at least 150 mins a week.  lifestyle modifications explained and pt understands importance of the above.  GERD start pt on  omeprazole 20 mg q pm  patient advised to avoid laying down soon after his meals. He took a 2 hours between dinner and bedtime. Avoid spicy food and triggers that he knows food wise that worsen his acid reflux. Patient verbalized understanding of the above. Lifestyle modifications as above discussed with patient.   HLD : NON FASTING labs last visit will need to recheck next visit.  Continue low fat diet advised.  Diet plan given to pt  pt to conitnue exercise regimen as currnet. Pt verbalises understanding of the above.    Problem List Items Addressed This Visit       Other   Atypical chest pain - Primary     No orders of the defined types were placed in this encounter.    Meds ordered this encounter  Medications   omeprazole (PRILOSEC) 20 MG capsule    Sig: Take 1 capsule (20 mg total) by mouth daily.    Dispense:  30 capsule    Refill:  1     Follow up plan: Return in about 6 weeks (around 09/12/2021).

## 2021-08-25 ENCOUNTER — Other Ambulatory Visit: Payer: Self-pay | Admitting: Internal Medicine

## 2021-08-25 NOTE — Telephone Encounter (Signed)
Requested Prescriptions  Pending Prescriptions Disp Refills   omeprazole (PRILOSEC) 20 MG capsule [Pharmacy Med Name: OMEPRAZOLE DR 20 MG CAPSULE] 30 capsule 1    Sig: TAKE 1 CAPSULE BY MOUTH EVERY DAY     Gastroenterology: Proton Pump Inhibitors Passed - 08/25/2021 11:33 AM      Passed - Valid encounter within last 12 months    Recent Outpatient Visits          3 weeks ago Atypical chest pain   [facility] Vigg, Avanti, MD   2 months ago Chest pain in adult   [facility] Vigg, Avanti, MD   3 months ago Other chest pain   [facility] Vigg, Avanti, MD   9 months ago Essential hypertension   [facility] [place], [place] T, NP   1 year ago Encounter for annual physical exam   [facility] [place], Dorie Rank, NP      Future Appointments            In 2 weeks Vigg, Avanti, MD [facility]   In 7 months Vigg, Avanti, MD [facility], [facility]

## 2021-09-08 ENCOUNTER — Other Ambulatory Visit: Payer: Self-pay | Admitting: Internal Medicine

## 2021-09-08 NOTE — Telephone Encounter (Signed)
Requested medication (s) are due for refill today: yes  Requested medication (s) are on the active medication list: yes  Last refill:  08/25/21   Future visit scheduled: yes  Notes to clinic:  pt requesting 90 day refills   Requested Prescriptions  Pending Prescriptions Disp Refills   omeprazole (PRILOSEC) 20 MG capsule [Pharmacy Med Name: OMEPRAZOLE DR 20 MG CAPSULE] 90 capsule 1    Sig: TAKE 1 CAPSULE BY MOUTH EVERY DAY     Gastroenterology: Proton Pump Inhibitors Passed - 09/08/2021 10:35 AM      Passed - Valid encounter within last 12 months    Recent Outpatient Visits           1 month ago Atypical chest pain   [facility] Vigg, Avanti, MD   2 months ago Chest pain in adult   [facility] Vigg, Avanti, MD   4 months ago Other chest pain   [facility] Vigg, Avanti, MD   9 months ago Essential hypertension   [facility] [place], [place] T, NP   1 year ago Encounter for annual physical exam   [facility] [place], Dorie Rank, NP       Future Appointments             In 4 days Vigg, Avanti, MD [facility], [facility]   In 7 months Vigg, Avanti, MD [facility], [facility]

## 2021-09-12 ENCOUNTER — Telehealth: Payer: BC Managed Care – PPO | Admitting: Internal Medicine

## 2021-09-19 ENCOUNTER — Other Ambulatory Visit: Payer: Self-pay | Admitting: Internal Medicine

## 2021-09-19 NOTE — Telephone Encounter (Signed)
°   Pt was to follow up 09/12/21, canceled, now not scheduled until Aug. Notes to clinic:  Request per pharm Pharmacy comment: REQUEST FOR 90 DAYS PRESCRIPTION.    Requested Prescriptions  Pending Prescriptions Disp Refills   omeprazole (PRILOSEC) 20 MG capsule [Pharmacy Med Name: OMEPRAZOLE DR 20 MG CAPSULE] 90 capsule 1    Sig: TAKE 1 CAPSULE BY MOUTH EVERY DAY     Gastroenterology: Proton Pump Inhibitors Passed - 09/19/2021  4:39 AM      Passed - Valid encounter within last 12 months    Recent Outpatient Visits           1 month ago Atypical chest pain   [facility] Vigg, Avanti, MD   3 months ago Chest pain in adult   [facility] Vigg, Avanti, MD   4 months ago Other chest pain   [facility] Vigg, Avanti, MD   10 months ago Essential hypertension   [facility] [place], [place] T, NP   1 year ago Encounter for annual physical exam   [facility] [place], Dorie Rank, NP       Future Appointments             In 6 months Vigg, Avanti, MD [facility], [facility]

## 2021-12-17 DIAGNOSIS — M25521 Pain in right elbow: Secondary | ICD-10-CM | POA: Diagnosis not present

## 2021-12-17 DIAGNOSIS — S46311A Strain of muscle, fascia and tendon of triceps, right arm, initial encounter: Secondary | ICD-10-CM | POA: Diagnosis not present

## 2021-12-17 DIAGNOSIS — S42401A Unspecified fracture of lower end of right humerus, initial encounter for closed fracture: Secondary | ICD-10-CM | POA: Diagnosis not present

## 2021-12-27 DIAGNOSIS — M25521 Pain in right elbow: Secondary | ICD-10-CM | POA: Diagnosis not present

## 2022-03-12 ENCOUNTER — Telehealth: Payer: Self-pay

## 2022-03-12 DIAGNOSIS — E559 Vitamin D deficiency, unspecified: Secondary | ICD-10-CM

## 2022-03-12 DIAGNOSIS — R7303 Prediabetes: Secondary | ICD-10-CM

## 2022-03-12 DIAGNOSIS — E785 Hyperlipidemia, unspecified: Secondary | ICD-10-CM

## 2022-03-12 DIAGNOSIS — I10 Essential (primary) hypertension: Secondary | ICD-10-CM

## 2022-03-12 DIAGNOSIS — E7800 Pure hypercholesterolemia, unspecified: Secondary | ICD-10-CM

## 2022-03-12 NOTE — Telephone Encounter (Unsigned)
Copied from CRM [id number]. Topic: General - Other >> Mar 12, 2022  2:41 PM Clide Dales wrote: Pt would like to come in for labs a week prior to his physical on 8/22 so they can go over results during visit. Did not see any lab orders in chart. Please follow up with pt to schedule

## 2022-03-13 NOTE — Telephone Encounter (Signed)
Patient is aware that he can come in  a week prior for lab work for his physical.

## 2022-03-14 DIAGNOSIS — B029 Zoster without complications: Secondary | ICD-10-CM | POA: Diagnosis not present

## 2022-03-26 IMAGING — CT CT HEART MORP W/ CTA COR W/ SCORE W/ CA W/CM &/OR W/O CM
1 of 13 series · 4 of 20 positions shown, 5 images · non-contrast
Comparison: None.

Addendum:
EXAM:
Cardiac/Coronary  CTA
TECHNIQUE: The patient was scanned on a Siemens Somatom go.Top scanner.

[Series 26: multiphase % cta coronary 0.60 · axial · 0.36mm/px · z∈[-1153,-1079]mm · 4 of 3720 slices shown, 5 images]
[im 744/3720  vessel]
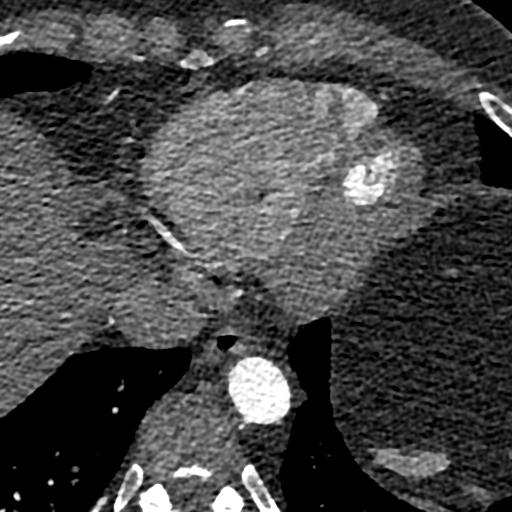
[im 744/3720  lung]
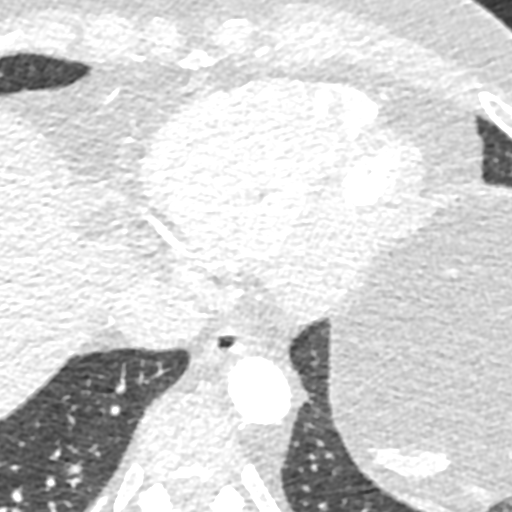
[im 1488/3720  vessel]
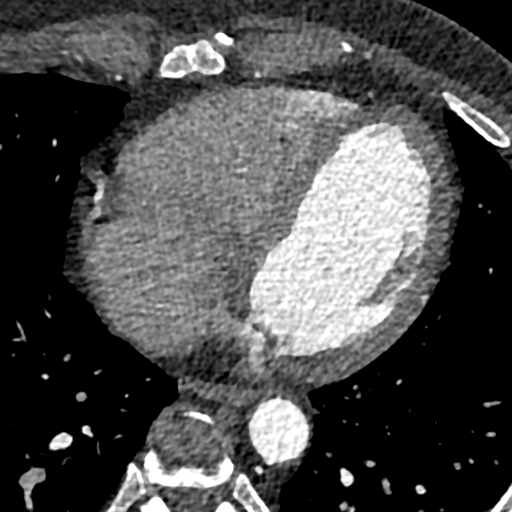
[im 2232/3720  vessel]
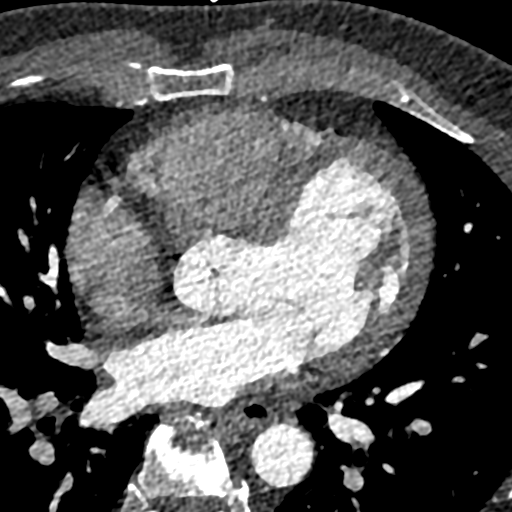
[im 2976/3720  vessel]
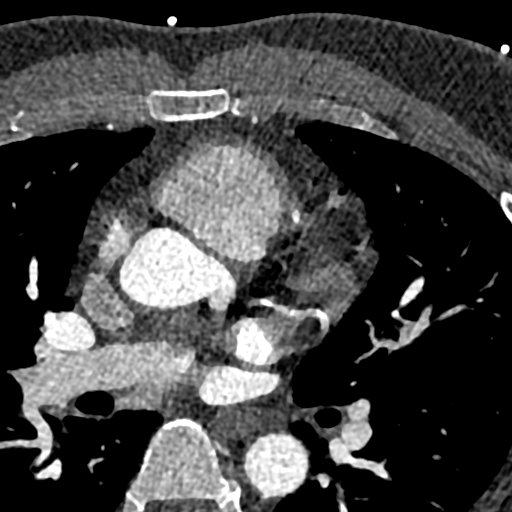

[4 of 20 positions shown; findings below may reference images not displayed]



Aortic Valve:  Trileaflet.  No calcifications.

Coronary Arteries:  Normal coronary origin.  Right dominance.

RCA is a dominant artery that gives rise to PDA and PLA. There is no
plaque.

Left main is a large artery that gives rise to a Ramus, LAD and LCX
arteries. There is no disease in the LM or Ramus.

LAD is a large vessel that has no plaque.

LCX is a non-dominant artery that gives rise to one large OM1
branch. There is no plaque.

Other findings:

Normal pulmonary vein drainage into the left atrium.

Normal left atrial appendage without a thrombus.

Normal size of the pulmonary artery.
IMPRESSION: 1. Normal coronary calcium score of 0. Patient is low risk for
coronary events.

2. Normal coronary origin with right dominance.

3. No evidence of CAD.

4. CAD-RADS 0. Consider non-atherosclerotic causes of chest pain.

EXAM:
OVER-READ INTERPRETATION  CT CHEST

The following report is an over-read performed by radiologist Dr.
over-read does not include interpretation of cardiac or coronary
anatomy or pathology. The CTA interpretation by the cardiologist is
attached.
FINDINGS: Limited view of the lung parenchyma demonstrates no suspicious
nodularity. Airways are normal.

Limited view of the mediastinum demonstrates no adenopathy.
Esophagus normal.

Limited view of the upper abdomen unremarkable.

Limited view of the skeleton and chest wall is unremarkable.
IMPRESSION: No significant extracardiac findings.

*** End of Addendum ***
EXAM:
Cardiac/Coronary  CTA
:
A retrospective scan was triggered in the descending thoracic aorta.
Axial non-contrast 3 mm slices were carried out through the heart.
The data set was analyzed on a dedicated work station and scored
using the Agatson method. Gantry rotation speed was 330 msecs and
collimation was .6 mm. 100mg of metoprolol and 0.8 mg of sl NTG was
given. The 3D data set was reconstructed in 5% intervals of the
60-95 % of the R-R cycle. Diastolic phases were analyzed on a
dedicated work station using MPR, MIP and VRT modes. The patient
received 75 cc of contrast.
FINDINGS: Aorta:  Normal size.  No calcifications.  No dissection.

Aortic Valve:  Trileaflet.  No calcifications.

Coronary Arteries:  Normal coronary origin.  Right dominance.

RCA is a dominant artery that gives rise to PDA and PLA. There is no
plaque.

Left main is a large artery that gives rise to a Ramus, LAD and LCX
arteries. There is no disease in the LM or Ramus.

LAD is a large vessel that has no plaque.

LCX is a non-dominant artery that gives rise to one large OM1
branch. There is no plaque.

Other findings:

Normal pulmonary vein drainage into the left atrium.

Normal left atrial appendage without a thrombus.

Normal size of the pulmonary artery.
IMPRESSION: 1. Normal coronary calcium score of 0. Patient is low risk for
coronary events.

2. Normal coronary origin with right dominance.

3. No evidence of CAD.

4. CAD-RADS 0. Consider non-atherosclerotic causes of chest pain.

## 2022-04-02 ENCOUNTER — Other Ambulatory Visit: Payer: BC Managed Care – PPO

## 2022-04-02 DIAGNOSIS — R7303 Prediabetes: Secondary | ICD-10-CM | POA: Diagnosis not present

## 2022-04-02 DIAGNOSIS — E7800 Pure hypercholesterolemia, unspecified: Secondary | ICD-10-CM | POA: Diagnosis not present

## 2022-04-02 DIAGNOSIS — I10 Essential (primary) hypertension: Secondary | ICD-10-CM | POA: Diagnosis not present

## 2022-04-02 DIAGNOSIS — E785 Hyperlipidemia, unspecified: Secondary | ICD-10-CM

## 2022-04-02 DIAGNOSIS — E559 Vitamin D deficiency, unspecified: Secondary | ICD-10-CM

## 2022-04-03 LAB — VITAMIN D 25 HYDROXY (VIT D DEFICIENCY, FRACTURES): Vit D, 25-Hydroxy: 37.2 ng/mL (ref 30.0–100.0)

## 2022-04-03 LAB — COMPREHENSIVE METABOLIC PANEL
ALT: 41 IU/L (ref 0–44)
AST: 23 IU/L (ref 0–40)
Albumin/Globulin Ratio: 2.1 (ref 1.2–2.2)
Albumin: 4.8 g/dL (ref 3.8–4.9)
Alkaline Phosphatase: 63 IU/L (ref 44–121)
BUN/Creatinine Ratio: 10 (ref 9–20)
BUN: 13 mg/dL (ref 6–24)
Bilirubin Total: 0.7 mg/dL (ref 0.0–1.2)
CO2: 24 mmol/L (ref 20–29)
Calcium: 9.8 mg/dL (ref 8.7–10.2)
Chloride: 104 mmol/L (ref 96–106)
Creatinine, Ser: 1.29 mg/dL — ABNORMAL HIGH (ref 0.76–1.27)
Globulin, Total: 2.3 g/dL (ref 1.5–4.5)
Glucose: 90 mg/dL (ref 70–99)
Potassium: 4.7 mmol/L (ref 3.5–5.2)
Sodium: 143 mmol/L (ref 134–144)
Total Protein: 7.1 g/dL (ref 6.0–8.5)
eGFR: 66 mL/min/{1.73_m2} (ref >59)

## 2022-04-03 LAB — CBC WITH DIFFERENTIAL/PLATELET
Basophils Absolute: 0 10*3/uL (ref 0.0–0.2)
Basos: 1 %
EOS (ABSOLUTE): 0 10*3/uL (ref 0.0–0.4)
Eos: 0 %
Hematocrit: 46.2 % (ref 37.5–51.0)
Hemoglobin: 15.3 g/dL (ref 13.0–17.7)
Immature Grans (Abs): 0 10*3/uL (ref 0.0–0.1)
Immature Granulocytes: 0 %
Lymphocytes Absolute: 1.5 10*3/uL (ref 0.7–3.1)
Lymphs: 25 %
MCH: 29.2 pg (ref 26.6–33.0)
MCHC: 33.1 g/dL (ref 31.5–35.7)
MCV: 88 fL (ref 79–97)
Monocytes Absolute: 0.4 10*3/uL (ref 0.1–0.9)
Monocytes: 7 %
Neutrophils Absolute: 4.2 10*3/uL (ref 1.4–7.0)
Neutrophils: 67 %
Platelets: 239 10*3/uL (ref 150–450)
RBC: 5.24 x10E6/uL (ref 4.14–5.80)
RDW: 13.2 % (ref 11.6–15.4)
WBC: 6.2 10*3/uL (ref 3.4–10.8)

## 2022-04-03 LAB — HEMOGLOBIN A1C
Est. average glucose Bld gHb Est-mCnc: 123 mg/dL
Hgb A1c MFr Bld: 5.9 % — ABNORMAL HIGH (ref 4.8–5.6)

## 2022-04-05 LAB — LIPID PANEL
Chol/HDL Ratio: 4.7 ratio (ref 0.0–5.0)
Cholesterol, Total: 229 mg/dL — ABNORMAL HIGH (ref 100–199)
HDL: 49 mg/dL (ref >39)
LDL Chol Calc (NIH): 146 mg/dL — ABNORMAL HIGH (ref 0–99)
Triglycerides: 191 mg/dL — ABNORMAL HIGH (ref 0–149)
VLDL Cholesterol Cal: 34 mg/dL (ref 5–40)

## 2022-04-05 LAB — SPECIMEN STATUS REPORT

## 2022-04-09 ENCOUNTER — Encounter: Payer: BC Managed Care – PPO | Admitting: Physician Assistant

## 2022-04-09 ENCOUNTER — Encounter: Payer: Self-pay | Admitting: Physician Assistant

## 2022-04-09 ENCOUNTER — Encounter: Payer: BC Managed Care – PPO | Admitting: Internal Medicine

## 2022-04-09 ENCOUNTER — Ambulatory Visit (INDEPENDENT_AMBULATORY_CARE_PROVIDER_SITE_OTHER): Payer: BC Managed Care – PPO | Admitting: Physician Assistant

## 2022-04-09 VITALS — BP 115/76 | HR 56 | Temp 98.5°F | Ht 69.6 in | Wt 221.0 lb

## 2022-04-09 DIAGNOSIS — I10 Essential (primary) hypertension: Secondary | ICD-10-CM | POA: Diagnosis not present

## 2022-04-09 DIAGNOSIS — Z0000 Encounter for general adult medical examination without abnormal findings: Secondary | ICD-10-CM

## 2022-04-09 MED ORDER — METOPROLOL SUCCINATE ER 25 MG PO TB24: 25.0000 mg | ORAL_TABLET | Freq: Every day | ORAL | 0 refills | Status: DC

## 2022-04-09 NOTE — Progress Notes (Signed)
Annual Physical Exam   Name: Harold [name]   MRN: [id number]    DOB: October 08, 1965   Date:04/09/2022  Today's Provider: Talitha Givens, MHS, PA-C Introduced myself to the patient as a PA-C and provided education on APPs in clinical practice.        Subjective  Chief Complaint  Chief Complaint  Patient presents with   Annual Exam    HPI  Patient presents for annual CPE.  IPSS Questionnaire (AUA-7): Over the past month.   1)  How often have you had a sensation of not emptying your bladder completely after you finish urinating?  0 - Not at all  2)  How often have you had to urinate again less than two hours after you finished urinating? 0 - Not at all  3)  How often have you found you stopped and started again several times when you urinated?  0 - Not at all  4) How difficult have you found it to postpone urination?  0 - Not at all  5) How often have you had a weak urinary stream?  1 - Less than 1 time in 5  6) How often have you had to push or strain to begin urination?  0 - Not at all  7) How many times did you most typically get up to urinate from the time you went to bed until the time you got up in the morning?  0 - None  Total score:  0-7 mildly symptomatic   8-19 moderately symptomatic   20-35 severely symptomatic     Diet: overall normal diet  Exercise: Regularly exercising- jogs/walks about 3 miles per week Sleep: Has had some sleep maintenance issues for the past 4 weeks. Wakes up about once per night but is able to go back to sleep.  Mood:overall good. Some work related stress but good overall   Depression: phq 9 is negative    04/09/2022   10:13 AM 06/20/2021   11:07 AM 04/27/2020   11:02 AM 03/08/2019    1:14 PM 01/22/2018    2:57 PM  Depression screen PHQ 2/9  Decreased Interest 0 0 0 0 0  Down, Depressed, Hopeless 0 0 0 0 0  PHQ - 2 Score 0 0 0 0 0  Altered sleeping 1 0 1 2   Tired, decreased energy 1 0 1 2   Change in appetite 1 0 0 1   Feeling bad  or failure about yourself  0 0 1 0   Trouble concentrating 0 0 0 0   Moving slowly or fidgety/restless 0 0 0 0   Suicidal thoughts 0 0 0 0   PHQ-9 Score 3 0 3 5   Difficult doing work/chores Not difficult at all Not difficult at all  Not difficult at all     Hypertension:  BP Readings from Last 3 Encounters:  04/09/22 115/76  08/01/21 106/72  07/05/21 113/75    Obesity: Wt Readings from Last 3 Encounters:  04/09/22 221 lb (100.2 kg)  08/01/21 217 lb 9.6 oz (98.7 kg)  07/02/21 220 lb 8 oz (100 kg)   BMI Readings from Last 3 Encounters:  04/09/22 32.08 kg/m  08/01/21 31.15 kg/m  07/02/21 31.64 kg/m     Lipids:  Lab Results  Component Value Date   CHOL 229 (H) 04/02/2022   CHOL 245 (H) 06/20/2021   CHOL 241 (H) 11/20/2020   Lab Results  Component Value Date   HDL  49 04/02/2022   HDL 62 06/20/2021   HDL 62 11/20/2020   Lab Results  Component Value Date   LDLCALC 146 (H) 04/02/2022   LDLCALC 157 (H) 06/20/2021   LDLCALC 160 (H) 11/20/2020   Lab Results  Component Value Date   TRIG 191 (H) 04/02/2022   TRIG 144 06/20/2021   TRIG 110 11/20/2020   Lab Results  Component Value Date   CHOLHDL 4.7 04/02/2022   CHOLHDL 3.8 01/22/2018   CHOLHDL 4.5 01/09/2017   No results found for: "LDLDIRECT" Glucose:  Glucose  Date Value Ref Range Status  04/02/2022 90 70 - 99 mg/dL Final  06/20/2021 89 70 - 99 mg/dL Final  11/20/2020 90 65 - 99 mg/dL Final   Glucose, Bld  Date Value Ref Range Status  01/22/2016 94 65 - 99 mg/dL Final    Flowsheet Row Office Visit from 05/22/2020 in [place]  AUDIT-C Score 0        Married STD testing and prevention (HIV/chl/gon/syphilis):  no Hep C Screening: discontinued  Skin cancer: Discussed monitoring for atypical lesions Colorectal cancer: Completed in 2017- next due in 2027 Prostate cancer:  no No results found for: "PSA"   Lung cancer:  Low Dose CT Chest recommended if Age 77-80 years, 30  pack-year currently smoking OR have quit w/in 15years. Patient  not applicable AAA: The USPSTF recommends one-time screening with ultrasonography in men ages 74 to 46 years who have ever smoked. Patient:  not applicable ECG:  NA  Vaccines:   HPV: Aged out  Tdap: Due today - discussed with patient  Shingrix: Recommend waiting at about 4 weeks to allow full recovery from recent Shingles outbreak  Pneumonia:  Flu: Due in Fall- recommend Sept/Oct timeline  COVID-19: has received 2 vaccines   Advanced Care Planning: A voluntary discussion about advance care planning including the explanation and discussion of advance directives.  Discussed health care proxy and Living will, and the patient was able to identify a health care proxy as his wife, Add Dinapoli . Patient does have a living will in effect.   Patient Active Problem List   Diagnosis Date Noted   Atypical chest pain 08/01/2021   Chest pain in adult 06/20/2021   Hyperlipidemia 06/20/2021   Elevated low density lipoprotein (LDL) cholesterol level 11/17/2020   Decreased hearing 01/22/2018   Pre-diabetes 03/19/2017   Vitamin D deficiency 02/02/2016   Chronic diarrhea 02/01/2016   Essential hypertension 09/20/2015    Past Surgical History:  Procedure Laterality Date   wisdom teeth removed      Family History  Problem Relation Age of Onset   Parkinson's disease Mother    Hypertension Mother    Alcohol abuse Father    Hypertension Father     Social History   Socioeconomic History   Marital status: Married    Spouse name: Not on file   Number of children: Not on file   Years of education: Not on file   Highest education level: Not on file  Occupational History   Not on file  Tobacco Use   Smoking status: Never   Smokeless tobacco: Never  Vaping Use   Vaping Use: Never used  Substance and Sexual Activity   Alcohol use: No   Drug use: No   Sexual activity: Not on file  Other Topics Concern   Not on file   Social History Narrative   Not on file   Social Determinants of Health   Financial Resource Strain:  Not on file  Food Insecurity: Not on file  Transportation Needs: Not on file  Physical Activity: Not on file  Stress: Not on file  Social Connections: Not on file  Intimate Partner Violence: Not on file     Current Outpatient Medications:    amLODipine (NORVASC) 10 MG tablet, Take 1 tablet (10 mg total) by mouth daily., Disp: 90 tablet, Rfl: 4   benazepril (LOTENSIN) 40 MG tablet, Take 1 tablet (40 mg total) by mouth daily., Disp: 90 tablet, Rfl: 4   metoprolol succinate (TOPROL-XL) 50 MG 24 hr tablet, Take 1 tablet (50 mg total) by mouth daily. Take with or immediately following a meal., Disp: 90 tablet, Rfl: 4  Allergies  Allergen Reactions   Hct [Hydrochlorothiazide] Other (See Comments)    ED     Review of Systems  Constitutional:  Positive for malaise/fatigue (recovering from shingles). Negative for chills, diaphoresis and fever.  HENT:  Negative for congestion, ear pain and sore throat.   Eyes:  Negative for blurred vision and double vision.  Respiratory:  Negative for shortness of breath and wheezing.   Cardiovascular:  Negative for chest pain, palpitations and leg swelling.  Gastrointestinal:  Negative for blood in stool, diarrhea, heartburn, nausea and vomiting.  Genitourinary:  Negative for frequency and urgency.  Musculoskeletal:  Negative for falls, joint pain and myalgias.  Skin:  Negative for itching and rash (shingles resolving).  Neurological:  Negative for dizziness, tingling, tremors, loss of consciousness, weakness and headaches.  Psychiatric/Behavioral:  Negative for depression. The patient is not nervous/anxious.        Objective  Vitals:   04/09/22 1006  BP: 115/76  Pulse: (!) 56  Temp: 98.5 F (36.9 C)  TempSrc: Oral  SpO2: 98%  Weight: 221 lb (100.2 kg)  Height: 5' 9.6" (1.768 m)    Body mass index is 32.08 kg/m.  Physical  Exam Vitals reviewed.  Constitutional:      General: He is awake.     Appearance: Normal appearance. He is well-developed, well-groomed and normal weight.  HENT:     Head: Normocephalic and atraumatic.     Right Ear: Hearing and ear canal normal. No decreased hearing noted. No laceration or drainage. There is impacted cerumen.     Left Ear: Hearing and ear canal normal. No decreased hearing noted. No laceration or drainage. There is impacted cerumen.     Mouth/Throat:     Lips: Pink.     Pharynx: Oropharynx is clear. Uvula midline. No pharyngeal swelling, oropharyngeal exudate, posterior oropharyngeal erythema or uvula swelling.  Eyes:     General: Lids are normal. Gaze aligned appropriately. No visual field deficit.    Extraocular Movements: Extraocular movements intact.     Conjunctiva/sclera: Conjunctivae normal.     Pupils: Pupils are equal, round, and reactive to light.  Neck:     Thyroid: No thyroid mass, thyromegaly or thyroid tenderness.  Cardiovascular:     Rate and Rhythm: Normal rate and regular rhythm.     Pulses: Normal pulses.          Radial pulses are 2+ on the right side and 2+ on the left side.     Heart sounds: Normal heart sounds. No murmur heard.    No friction rub. No gallop.  Pulmonary:     Effort: Pulmonary effort is normal.     Breath sounds: Normal breath sounds. No decreased air movement. No decreased breath sounds, wheezing, rhonchi or rales.  Abdominal:  General: Abdomen is flat. Bowel sounds are normal.     Palpations: Abdomen is soft.     Tenderness: There is no abdominal tenderness.  Musculoskeletal:     Cervical back: Normal range of motion and neck supple.     Right lower leg: No edema.     Left lower leg: No edema.  Neurological:     General: No focal deficit present.     Mental Status: He is alert and oriented to person, place, and time. Mental status is at baseline.     GCS: GCS eye subscore is 4. GCS verbal subscore is 5. GCS motor  subscore is 6.     Cranial Nerves: No dysarthria or facial asymmetry.     Motor: Motor function is intact. No weakness, tremor or atrophy.     Gait: Gait is intact.     Deep Tendon Reflexes:     Reflex Scores:      Tricep reflexes are 2+ on the right side and 2+ on the left side.      Patellar reflexes are 2+ on the right side and 2+ on the left side. Psychiatric:        Attention and Perception: Attention and perception normal.        Mood and Affect: Mood and affect normal.        Speech: Speech normal.        Behavior: Behavior normal. Behavior is cooperative.        Thought Content: Thought content normal.        Cognition and Memory: Cognition and memory normal.        Judgment: Judgment normal.      Recent Results (from the past 2160 hour(s))  Vitamin D (25 hydroxy)     Status: None   Collection Time: 04/02/22 10:11 AM  Result Value Ref Range   Vit D, 25-Hydroxy 37.2 30.0 - 100.0 ng/mL    Comment: Vitamin D deficiency has been defined by the [place] practice guideline as a level of serum 25-OH vitamin D less than 20 ng/mL (1,2). The Endocrine Society went on to further define vitamin D insufficiency as a level between 21 and 29 ng/mL (2). 1. IOM (Institute of Medicine). 2010. Dietary reference    intakes for calcium and D. [place]: The    [employer]. 2. Holick MF, Binkley [place], Bischoff-Ferrari HA, et al.    Evaluation, treatment, and prevention of vitamin D    deficiency: an Endocrine Society clinical practice    guideline. JCEM. 2011 Jul; 96(7):1911-30.   CBC w/Diff     Status: None   Collection Time: 04/02/22 10:11 AM  Result Value Ref Range   WBC 6.2 3.4 - 10.8 x10E3/uL   RBC 5.24 4.14 - 5.80 x10E6/uL   Hemoglobin 15.3 13.0 - 17.7 g/dL   Hematocrit 46.2 37.5 - 51.0 %   MCV 88 79 - 97 fL   MCH 29.2 26.6 - 33.0 pg   MCHC 33.1 31.5 - 35.7 g/dL   RDW 13.2 11.6 - 15.4 %   Platelets 239 150 - 450 x10E3/uL    Neutrophils 67 Not Estab. %   Lymphs 25 Not Estab. %   Monocytes 7 Not Estab. %   Eos 0 Not Estab. %   Basos 1 Not Estab. %   Neutrophils Absolute 4.2 1.4 - 7.0 x10E3/uL   Lymphocytes Absolute 1.5 0.7 - 3.1 x10E3/uL   Monocytes Absolute 0.4 0.1 - 0.9  x10E3/uL   EOS (ABSOLUTE) 0.0 0.0 - 0.4 x10E3/uL   Basophils Absolute 0.0 0.0 - 0.2 x10E3/uL   Immature Granulocytes 0 Not Estab. %   Immature Grans (Abs) 0.0 0.0 - 0.1 x10E3/uL  Comp Met (CMET)     Status: Abnormal   Collection Time: 04/02/22 10:11 AM  Result Value Ref Range   Glucose 90 70 - 99 mg/dL   BUN 13 6 - 24 mg/dL   Creatinine, Ser 1.29 (H) 0.76 - 1.27 mg/dL   eGFR 66 >59 mL/min/1.73   BUN/Creatinine Ratio 10 9 - 20   Sodium 143 134 - 144 mmol/L   Potassium 4.7 3.5 - 5.2 mmol/L   Chloride 104 96 - 106 mmol/L   CO2 24 20 - 29 mmol/L   Calcium 9.8 8.7 - 10.2 mg/dL   Total Protein 7.1 6.0 - 8.5 g/dL   Albumin 4.8 3.8 - 4.9 g/dL   Globulin, Total 2.3 1.5 - 4.5 g/dL   Albumin/Globulin Ratio 2.1 1.2 - 2.2   Bilirubin Total 0.7 0.0 - 1.2 mg/dL   Alkaline Phosphatase 63 44 - 121 IU/L   AST 23 0 - 40 IU/L   ALT 41 0 - 44 IU/L  HgB A1c     Status: Abnormal   Collection Time: 04/02/22 10:11 AM  Result Value Ref Range   Hgb A1c MFr Bld 5.9 (H) 4.8 - 5.6 %    Comment:          Prediabetes: 5.7 - 6.4          Diabetes: >6.4          Glycemic control for adults with diabetes: <7.0    Est. average glucose Bld gHb Est-mCnc 123 mg/dL  Lipid panel     Status: Abnormal   Collection Time: 04/02/22 10:11 AM  Result Value Ref Range   Cholesterol, Total 229 (H) 100 - 199 mg/dL   Triglycerides 191 (H) 0 - 149 mg/dL   HDL 49 >39 mg/dL   VLDL Cholesterol Cal 34 5 - 40 mg/dL   LDL Chol Calc (NIH) 146 (H) 0 - 99 mg/dL   Chol/HDL Ratio 4.7 0.0 - 5.0 ratio    Comment:                                   T. Chol/HDL Ratio                                             Men  Women                               1/2 Avg.Risk  3.4    3.3                                    Avg.Risk  5.0    4.4                                2X Avg.Risk  9.6    7.1  3X Avg.Risk 23.4   11.0   Specimen status report     Status: None   Collection Time: 04/02/22 10:11 AM  Result Value Ref Range   specimen status report Comment     Comment: Written Authorization Written Authorization Written Authorization Received. Authorization received from Makenah Karas 04-04-2022 Logged by Ivar Bury      Fall Risk:    04/09/2022   10:13 AM 08/01/2021   11:14 AM 06/20/2021   11:06 AM 04/27/2020   11:01 AM 03/30/2018    4:22 PM  [place] in the past year? 0 0 0 0 No  Number falls in past yr: 0 0 0 0   Injury with Fall? 0 0 0 0   Risk for fall due to : No Fall Risks No Fall Risks No Fall Risks    Follow up Falls evaluation completed Falls evaluation completed Falls evaluation completed       Functional Status Survey:      Assessment & Plan  Problem List Items Addressed This Visit       Cardiovascular and Mediastinum   Essential hypertension Chronic, historic condition Reports some intermittent dizziness with standing and intermittent, resolving chest pain  Will dc Metoprolol with taper - halving dose over several weeks Recommend follow up in 2 months to discuss progress and response    Relevant Medications   metoprolol succinate (TOPROL-XL) 25 MG 24 hr tablet   Other Visit Diagnoses     Encounter for routine history and physical examination of adult    -  Primary -Prostate cancer screening and PSA options (with potential risks and benefits of testing vs not testing) were discussed along with recent recs/guidelines. -USPSTF grade A and B recommendations reviewed with patient; age-appropriate recommendations, preventive care, screening tests, etc discussed and encouraged; healthy living encouraged; see AVS for patient education given to patient -Discussed importance of 150 minutes of physical activity weekly,  eat two servings of fish weekly, eat one serving of tree nuts ( cashews, pistachios, pecans, almonds.Marland Kitchen) every other day, eat 6 servings of fruit/vegetables daily and drink plenty of water and avoid sweet beverages.  -Reviewed Health Maintenance: yes        Return in about 2 months (around 06/09/2022) for HTN, metoprolol dc .   I, Prescilla Monger E Aime Carreras, PA-C, have reviewed all documentation for this visit. The documentation on 04/09/22 for the exam, diagnosis, procedures, and orders are all accurate and complete.   Talitha Givens, MHS, PA-C [facility]

## 2022-04-09 NOTE — Patient Instructions (Signed)
Please schedule a nurse visit to get your Shingles vaccine in 2 months

## 2022-04-09 NOTE — Assessment & Plan Note (Signed)
Chronic, historic condition Reports some intermittent dizziness with standing and intermittent, resolving chest pain  Will dc Metoprolol with taper - halving dose over several weeks Recommend follow up in 2 months to discuss progress and response

## 2022-05-04 ENCOUNTER — Emergency Department: Payer: BC Managed Care – PPO

## 2022-05-04 ENCOUNTER — Emergency Department
Admission: EM | Admit: 2022-05-04 | Discharge: 2022-05-04 | Disposition: A | Discharge status: Home / Self Care | Payer: BC Managed Care – PPO | Attending: Emergency Medicine | Admitting: Emergency Medicine

## 2022-05-04 ENCOUNTER — Other Ambulatory Visit: Payer: Self-pay

## 2022-05-04 DIAGNOSIS — I10 Essential (primary) hypertension: Secondary | ICD-10-CM | POA: Insufficient documentation

## 2022-05-04 DIAGNOSIS — Z79899 Other long term (current) drug therapy: Secondary | ICD-10-CM | POA: Diagnosis not present

## 2022-05-04 DIAGNOSIS — R079 Chest pain, unspecified: Secondary | ICD-10-CM | POA: Diagnosis not present

## 2022-05-04 DIAGNOSIS — R0602 Shortness of breath: Secondary | ICD-10-CM | POA: Diagnosis not present

## 2022-05-04 DIAGNOSIS — R002 Palpitations: Secondary | ICD-10-CM | POA: Diagnosis not present

## 2022-05-04 DIAGNOSIS — I4891 Unspecified atrial fibrillation: Secondary | ICD-10-CM | POA: Insufficient documentation

## 2022-05-04 LAB — CBC
HCT: 44.2 % (ref 39.0–52.0)
Hemoglobin: 14.5 g/dL (ref 13.0–17.0)
MCH: 29.4 pg (ref 26.0–34.0)
MCHC: 32.8 g/dL (ref 30.0–36.0)
MCV: 89.5 fL (ref 80.0–100.0)
Platelets: 238 10*3/uL (ref 150–400)
RBC: 4.94 MIL/uL (ref 4.22–5.81)
RDW: 13.1 % (ref 11.5–15.5)
WBC: 8.1 10*3/uL (ref 4.0–10.5)
nRBC: 0 % (ref 0.0–0.2)

## 2022-05-04 LAB — BASIC METABOLIC PANEL
Anion gap: 8 (ref 5–15)
BUN: 15 mg/dL (ref 6–20)
CO2: 27 mmol/L (ref 22–32)
Calcium: 9.1 mg/dL (ref 8.9–10.3)
Chloride: 106 mmol/L (ref 98–111)
Creatinine, Ser: 1.17 mg/dL (ref 0.61–1.24)
GFR, Estimated: >60 mL/min (ref >60)
Glucose, Bld: 102 mg/dL — ABNORMAL HIGH (ref 70–99)
Potassium: 3.9 mmol/L (ref 3.5–5.1)
Sodium: 141 mmol/L (ref 135–145)

## 2022-05-04 LAB — HEPATIC FUNCTION PANEL
ALT: 31 U/L (ref 0–44)
AST: 37 U/L (ref 15–41)
Albumin: 4.1 g/dL (ref 3.5–5.0)
Alkaline Phosphatase: 53 U/L (ref 38–126)
Bilirubin, Direct: 0.4 mg/dL — ABNORMAL HIGH (ref 0.0–0.2)
Indirect Bilirubin: 0.9 mg/dL (ref 0.3–0.9)
Total Bilirubin: 1.3 mg/dL — ABNORMAL HIGH (ref 0.3–1.2)
Total Protein: 7.1 g/dL (ref 6.5–8.1)

## 2022-05-04 LAB — T4, FREE: Free T4: 0.84 ng/dL (ref 0.61–1.12)

## 2022-05-04 LAB — TSH: TSH: 1.338 u[IU]/mL (ref 0.350–4.500)

## 2022-05-04 LAB — BRAIN NATRIURETIC PEPTIDE: B Natriuretic Peptide: 18.1 pg/mL (ref 0.0–100.0)

## 2022-05-04 LAB — D-DIMER, QUANTITATIVE: D-Dimer, Quant: <0.27 ug/mL-FEU (ref 0.00–0.50)

## 2022-05-04 LAB — MAGNESIUM: Magnesium: 2.3 mg/dL (ref 1.7–2.4)

## 2022-05-04 LAB — TROPONIN I (HIGH SENSITIVITY)
Troponin I (High Sensitivity): 5 ng/L (ref <18)
Troponin I (High Sensitivity): 5 ng/L (ref <18)

## 2022-05-04 MED ORDER — RIVAROXABAN 20 MG PO TABS: 20.0000 mg | ORAL_TABLET | Freq: Every day | ORAL | 0 refills | Status: DC

## 2022-05-04 MED ORDER — AMLODIPINE BESYLATE 10 MG PO TABS: 5.0000 mg | ORAL_TABLET | Freq: Every day | ORAL | 4 refills | Status: DC

## 2022-05-04 NOTE — ED Triage Notes (Signed)
Pt states that he is having chest pain in the center of his chest, heart palpitations, and low BP (107/70)- pt states this started around 1pm- pt was also having some SHOB, denies nausea

## 2022-05-04 NOTE — ED Provider Notes (Signed)
[facility] Provider Note    Event Date/Time   First MD Initiated Contact with Patient 05/04/22 1840     (approximate)   History   Chest Pain   HPI  Harold [name] is a 56 y.o. male who comes in with chest pain in the center of his chest, heart palpitations and low blood pressure.  This started around 1 PM.  Patient reports that he took 25 mg of metoprolol and he is feeling much better at this time.  He reports that he is on 3 different medications for his hypertension and they were coming down on his metoprolol that he was initially at 15 is titrated down to 12.5.  He reports some intermittent palpitations previously but nothing like today.  He had a little bit of chest pain and shortness of breath associated with it.  Denies any risk factors for PE.  Denies any abdominal pain.  Denies any significant caffeine use or alcohol use.   I reviewed the note by Dr. Rockey Situ in November 2022 where patient has a history of hyperlipidemia, hypertension and his father died in his 74s from cardiac reasons.  They recommended a cardiac CTA.  I reviewed these records and it was reassuring.   Physical Exam   Triage Vital Signs: ED Triage Vitals  Enc Vitals Group     BP 05/04/22 1829 (!) 138/90     Pulse Rate 05/04/22 1829 98     Resp 05/04/22 1829 18     Temp 05/04/22 1829 97.7 F (36.5 C)     Temp Source 05/04/22 1829 Oral     SpO2 05/04/22 1829 99 %     Weight 05/04/22 1830 215 lb (97.5 kg)     Height 05/04/22 1830 5\' 10"  (1.778 m)     Head Circumference --      Peak Flow --      Pain Score 05/04/22 1830 2     Pain Loc --      Pain Edu? --      Excl. in [place]? --     Most recent vital signs: Vitals:   05/04/22 1829  BP: (!) 138/90  Pulse: 98  Resp: 18  Temp: 97.7 F (36.5 C)  SpO2: 99%     General: Awake, no distress.  CV:  Good peripheral perfusion.  Irregular Resp:  Normal effort.  Abd:  No distention.  Soft and nontender Other:  No leg  swelling.  No calf tenderness   ED Results / Procedures / Treatments   Labs (all labs ordered are listed, but only abnormal results are displayed) Labs Reviewed  BASIC METABOLIC PANEL  CBC  MAGNESIUM  D-DIMER, QUANTITATIVE  TSH  T4, FREE  HEPATIC FUNCTION PANEL  BRAIN NATRIURETIC PEPTIDE  TROPONIN I (HIGH SENSITIVITY)     EKG  My interpretation of EKG:  Atrial fibrillation rate of 98 without any ST elevation or T wave inversions, normal intervals  Repeat EKG remains atrial fibrillation rate of 76 that any ST elevation or T wave inversions, normal intervals  RADIOLOGY I have reviewed the xray personally and interpreted and no evidence of any pneumonia or pulmonary edema   PROCEDURES:  Critical Care performed: No  .1-3 Lead EKG Interpretation  Performed by: Vanessa Heeney, MD Authorized by: Vanessa [place], MD     Interpretation: abnormal     ECG rate:  70   ECG rate assessment: tachycardic     Rhythm: atrial fibrillation  Ectopy: none     Conduction: normal      MEDICATIONS ORDERED IN ED: Medications - No data to display   IMPRESSION / MDM / ASSESSMENT AND PLAN / ED COURSE  I reviewed the triage vital signs and the nursing notes.   Patient's presentation is most consistent with acute presentation with potential threat to life or bodily function.   Patient comes in with some chest pain and patient took 25 mg of metoprolol prior to coming in therefore his heart rates are rate controlled atrial fibrillation.  Get labs to evaluate for Electra abnormalities, AKI, ACS, heart failure, PE.  BNP is normal.  No evidence of heart failure.  CBC normal troponin negative magnesium normal D-dimer negative thyroid is reassuring.  Potassium is 3.9.  Had a conversation with patient about the possible cardioversion versus just going back on his  metoprolol given he is currently rate controlled.  Given patient is not significantly symptomatic now that heart rates are  controlled he would prefer to hold off on cardioversions due to the risk and go back onto his metoprolol.  Discussed with Dr. Mayford Knife as well and she would recommend coming down on the amlodipine to 5 mg and restarting metoprolol XL 50 mg and started on either Eliquis or Xarelto (nicer because once daily dosing) Pt prefers xarelto due to once daily dosing-d/w pharmracy and recommend the 20mg  dosing daily. We discussed the risks of bleeding and he understands return precautions with this.  Discussed with patient and he expressed understanding and felt comfortable with the plan.  He will follow-up with Dr. .  He would prefer the once daily dosing with Xarelto.  We discussed doing the 25 mg XL tomorrow and rechecking heart rates around noon and if it still staying elevated that he can do the other 25 versus just restarting the 50s and its he is tolerated this previously.  He expressed understanding and felt comfortable with this plan  Considered admission given new A-fib but given no evidence of any issues from it and patient feels much better with the heart rates controlled patient feels comfortable discharge    The patient is on the cardiac monitor to evaluate for evidence of arrhythmia and/or significant heart rate changes.      FINAL CLINICAL IMPRESSION(S) / ED DIAGNOSES   Final diagnoses:  Atrial fibrillation, unspecified type (HCC)     Rx / DC Orders   ED Discharge Orders          Ordered    amLODipine (NORVASC) 10 MG tablet  Daily        05/04/22 2235    rivaroxaban (XARELTO) 20 MG TABS tablet  Daily with supper        05/04/22 2237             Note:  This document was prepared using Dragon voice recognition software and may include unintentional dictation errors.   2238, MD 05/04/22 2238

## 2022-05-04 NOTE — Discharge Instructions (Addendum)
You can take your metoprolol 50 mg XL since you have tolerated this previously or if you notice your heart rates are going low you can take the metoprolol 25 mg XL wait a few hours and if your heart rates are staying above 80-90 you would tolerate the additional 25 mg XL  Decrease amlodipine to 1/2 pill.   Start the blood thinner-- return for black stools/falling hit his head.   Follow-up with Dr. Donivan Scull office to discuss further work-up of this

## 2022-05-13 ENCOUNTER — Telehealth: Payer: Self-pay | Admitting: Cardiovascular Disease

## 2022-05-13 NOTE — Telephone Encounter (Signed)
-----   Message from Minna Merritts, MD sent at 05/11/2022 11:44 AM EDT ----- Can we set him up with an appointment [facility] or APP Thx TG  ----- Message ----- From: Vanessa Kings [place], MD Sent: 05/04/2022  10:02 PM EDT To: Minna Merritts, MD  Can you follow up with him outpatient next week-- he came in with new afib.  Thanks! Stanton Kidney

## 2022-05-13 NOTE — Telephone Encounter (Signed)
LMOV  

## 2022-05-25 NOTE — Progress Notes (Unsigned)
Cardiology Office Note  Date:  05/27/2022   ID:  Harold [name], DOB November 02, 1965, MRN [id number]  PCP:  Loura Pardon, MD (Inactive)   Chief Complaint  Patient presents with   Other    Afib no complaints today. Meds reviewed verbally with pt.    HPI:  Harold [name] is a 56 year old gentleman with past medical history of Nonsmoker Nondiabetes HTN Cardiac CTA: no CAD Paroxysmal atrial fibrillation, CHADS VASC 1 (HTN) Who presents for follow-up of his chest pain, atrial fibrillation  Last seen by myself in clinic November 2022 Cardiac CTA November 2022 ordered for chest pain showing  calcium score 0, no significant coronary disease  Seen in the ER May 04, 2022 for atrial fibrillation Amlodipine was decreased down to 5, started metoprolol succinate 50 daily, Xarelto 20 daily was initiated Has a smart watch to track atrial fibrillation Had atrial fibrillation September 16, and 17th Does not appear to have much atrial fibrillation since that time  In follow-up today reports that he often will forget the Xarelto in the evening as he takes all of his medications in the morning Occasional lightheadedness in the day, not sure if he is drinking enough fluids Reports he is taking amlodipine 5 mg daily, benazepril 40 mg daily, metoprolol succinate 25 daily  EKG personally reviewed by myself on todays visit NSr rate 65 bpm no significant ST-T wave changes  Father died accident in 21s No other significant family history coronary disease  PMH:   has a past medical history of Hyperlipidemia and Hypertension.  PSH:    Past Surgical History:  Procedure Laterality Date   wisdom teeth removed      Current Outpatient Medications  Medication Sig Dispense Refill   amLODipine (NORVASC) 10 MG tablet Take 0.5 tablets (5 mg total) by mouth daily. 90 tablet 4   benazepril (LOTENSIN) 40 MG tablet Take 1 tablet (40 mg total) by mouth daily. 90 tablet 4   metoprolol succinate  (TOPROL-XL) 25 MG 24 hr tablet Take 1 tablet (25 mg total) by mouth daily. Take 25 mg for 7 days then take 12.5 mg (half tablet) for 7 days. Then take half tablet every other day for one week. Then stop completely. 10 tablet 0   rivaroxaban (XARELTO) 20 MG TABS tablet Take 1 tablet (20 mg total) by mouth daily with supper. 30 tablet 0   No current facility-administered medications for this visit.    Allergies:   Hct [hydrochlorothiazide]   Social History:  The patient  reports that he has never smoked. He has never used smokeless tobacco. He reports that he does not drink alcohol and does not use drugs.   Family History:   family history includes Alcohol abuse in his father; Hypertension in his father and mother; Parkinson's disease in his mother.    Review of Systems: Review of Systems  Constitutional: Negative.   HENT: Negative.    Respiratory: Negative.    Cardiovascular:  Positive for chest pain.  Gastrointestinal: Negative.   Musculoskeletal: Negative.   Neurological: Negative.   Psychiatric/Behavioral: Negative.    All other systems reviewed and are negative.   PHYSICAL EXAM: VS:  BP 124/86 (BP Location: Left Arm, Patient Position: Sitting, Cuff Size: Normal)   Pulse 65   Ht 5\' 9"  (1.753 m)   Wt 225 lb 4 oz (102.2 kg)   SpO2 98%   BMI 33.26 kg/m  , BMI Body mass index is 33.26 kg/m. Constitutional:  oriented to person, place,  and time. No distress.  HENT:  Head: Grossly normal Eyes:  no discharge. No scleral icterus.  Neck: No JVD, no carotid bruits  Cardiovascular: Regular rate and rhythm, no murmurs appreciated Pulmonary/Chest: Clear to auscultation bilaterally, no wheezes or rails Abdominal: Soft.  no distension.  no tenderness.  Musculoskeletal: Normal range of motion Neurological:  normal muscle tone. Coordination normal. No atrophy Skin: Skin warm and dry Psychiatric: normal affect, pleasant  Recent Labs: 05/04/2022: ALT 31; B Natriuretic Peptide 18.1; BUN  15; Creatinine, Ser 1.17; Hemoglobin 14.5; Magnesium 2.3; Platelets 238; Potassium 3.9; Sodium 141; TSH 1.338    Lipid Panel Lab Results  Component Value Date   CHOL 229 (H) 04/02/2022   HDL 49 04/02/2022   LDLCALC 146 (H) 04/02/2022   TRIG 191 (H) 04/02/2022      Wt Readings from Last 3 Encounters:  05/27/22 225 lb 4 oz (102.2 kg)  05/04/22 215 lb (97.5 kg)  04/09/22 221 lb (100.2 kg)      ASSESSMENT AND PLAN:  Problem List Items Addressed This Visit       Cardiology Problems   Essential hypertension   Relevant Orders   EKG 12-Lead   Hyperlipidemia   Relevant Orders   EKG 12-Lead     Other   Chest pain in adult   Relevant Orders   EKG 12-Lead   Other Visit Diagnoses     Paroxysmal atrial fibrillation ([place])    -  Primary   Relevant Orders   EKG 12-Lead     Angina/chest pain Cardiac CTA no CAD No significant chest pain on exertion Cholesterol is elevated, discussed on today's visit  Paroxysmal atrial fibrillation Maintaining normal sinus rhythm today Noted in September 2023 on the 16th and 17th CHADS VASC1 Low to moderate risk, will often miss Xarelto as he misses his medications in the evening Recommend he continue to closely monitor for atrial fibrillation on his watch  Hyperlipidemia We have encouraged continued exercise, careful diet management in an effort to lose weight.  Essential hypertension Rare orthostasis symptoms, blood pressure well controlled today Recommend he stay hydrated, call our office if blood pressure runs low or for worsening orthostasis symptoms Tend to the amlodipine could be held or benazepril cut in half   Total encounter time more than 30 minutes  Greater than 50% was spent in counseling and coordination of care with the patient   Signed, Esmond Plants, M.D., Ph.D. [place], [place]

## 2022-05-27 ENCOUNTER — Encounter: Payer: Self-pay | Admitting: Cardiovascular Disease

## 2022-05-27 ENCOUNTER — Ambulatory Visit: Payer: BC Managed Care – PPO | Attending: Cardiovascular Disease | Admitting: Cardiovascular Disease

## 2022-05-27 VITALS — BP 124/86 | HR 65 | Ht 69.0 in | Wt 225.2 lb

## 2022-05-27 DIAGNOSIS — E782 Mixed hyperlipidemia: Secondary | ICD-10-CM

## 2022-05-27 DIAGNOSIS — I480 Paroxysmal atrial fibrillation: Secondary | ICD-10-CM | POA: Diagnosis not present

## 2022-05-27 DIAGNOSIS — I10 Essential (primary) hypertension: Secondary | ICD-10-CM | POA: Diagnosis not present

## 2022-05-27 DIAGNOSIS — R079 Chest pain, unspecified: Secondary | ICD-10-CM | POA: Diagnosis not present

## 2022-05-27 MED ORDER — METOPROLOL SUCCINATE ER 25 MG PO TB24: 25.0000 mg | ORAL_TABLET | Freq: Every day | ORAL | 3 refills | Status: DC

## 2022-05-27 NOTE — Patient Instructions (Addendum)
Monitor blood pressure,  For dizzy spells, drink more fluids If still dizzy, we may need to cut amlodipine  Medication Instructions:  No changes  If you need a refill on your cardiac medications before your next appointment, please call your pharmacy.    Lab work: No new labs needed   Testing/Procedures: No new testing needed   Follow-Up: At [facility], you and your health needs are our priority.  As part of our continuing mission to provide you with exceptional heart care, we have created designated Provider Care Teams.  These Care Teams include your primary Cardiologist (physician) and Advanced Practice Providers (APPs -  Physician Assistants and Nurse Practitioners) who all work together to provide you with the care you need, when you need it.  You will need a follow up appointment in 12 months  Providers on your designated Care Team:   Murray Hodgkins, NP Christell Faith, PA-C Cadence Kathlen Mody, [place]  COVID-19 Vaccine Information can be found at: [email or web address] For questions related to vaccine distribution or appointments, please email [email or web address] or call [phone].

## 2022-06-11 ENCOUNTER — Ambulatory Visit: Payer: BC Managed Care – PPO | Admitting: Physician Assistant

## 2022-06-11 ENCOUNTER — Encounter: Payer: Self-pay | Admitting: Physician Assistant

## 2022-06-11 VITALS — BP 112/78 | HR 57 | Temp 98.5°F | Ht 69.02 in | Wt 223.1 lb

## 2022-06-11 DIAGNOSIS — Z23 Encounter for immunization: Secondary | ICD-10-CM | POA: Diagnosis not present

## 2022-06-11 DIAGNOSIS — E782 Mixed hyperlipidemia: Secondary | ICD-10-CM | POA: Diagnosis not present

## 2022-06-11 DIAGNOSIS — I10 Essential (primary) hypertension: Secondary | ICD-10-CM

## 2022-06-11 DIAGNOSIS — I4891 Unspecified atrial fibrillation: Secondary | ICD-10-CM | POA: Diagnosis not present

## 2022-06-11 DIAGNOSIS — I480 Paroxysmal atrial fibrillation: Secondary | ICD-10-CM | POA: Insufficient documentation

## 2022-06-11 MED ORDER — AMLODIPINE BESYLATE 5 MG PO TABS: 5.0000 mg | ORAL_TABLET | Freq: Every day | ORAL | 0 refills | Status: DC

## 2022-06-11 NOTE — Assessment & Plan Note (Addendum)
Chronic, historic condition He is taking Amlodipine 5 mg PO QD, Benazepril 40 mg PO QD and Metoprolol 25 mg PO QD He was previously seen by Dr. Rockey Situ after being dx with intermittent a fib and was instructed to halve Amlodipine and continue with Metoprolol  He reports he is still having some lightheadedness and dizziness with standing and is curious if Amlodipine could be tapered further or even stopped Reviewed maintaining hydration with him and provided 5 mg Amlodipine so he will not have to cut tabs in half anymore for maintenance dose If lightheadedness is not improving with hydration efforts after 2-3 weeks, recommend he try to take Amlodipine 2.5 mg PO QD instead  Recommend he monitor BP at home for response to these changes Continue Benazepril 40 mg PO QD for HTN management  Follow up in 3 months for monitoring or sooner if concerns arise

## 2022-06-11 NOTE — Patient Instructions (Addendum)
Please make sure you are drinking 65-75 oz of water per day. This can help with the lightheadedness and dizziness You can discard the Amlodipine 10 mg tablets and use the 5 mg ones I have sent in  Try this for at least 2 weeks- if the dizziness is not improving you can cut the Amlodipine 5 mg in half to equal 2.5 mg per day.   Please continue to check your blood pressure several times per week and let us know if it starts creeping up.

## 2022-06-11 NOTE — Assessment & Plan Note (Signed)
Acute on chronic, newly diagnosed  Patient is followed by Cardiology who recommended he halve Amlodipine and continue Metoprolol 25 mg PO QD - agree with plan Will provide new script for Amlodipine 5 mg PO QD so he doesn't have to cut tabs in half He is taking Xarelto for anticoagulation maintenance  Recommend continued collaboration and follow up with Cardiology Follow up with PCP office in 3 months for monitoring

## 2022-06-11 NOTE — Assessment & Plan Note (Signed)
Chronic, ongoing Currently not treated with medication Discussed impact that high cholesterol and HTN can have on CVD health  Patient is wary of adding more medications to his regimen as he states he is having a difficult time with taking the ones he is on Recommend starting a low intensity statin when he is more amenable to assist with cholesterol management  Follow up in 3 months

## 2022-06-11 NOTE — Progress Notes (Signed)
Established Patient Office Visit  Name: Harold [name]   MRN: [id number]    DOB: 02-24-1966   Date:06/11/2022  Today's Provider: Talitha Givens, MHS, PA-C Introduced myself to the patient as a PA-C and provided education on APPs in clinical practice.         Subjective  Chief Complaint  Chief Complaint  Patient presents with   Hypertension   Hyperlipidemia    Hypertension Associated symptoms include chest pain (random, unsure about resolving condition). Pertinent negatives include no blurred vision, headaches, palpitations or shortness of breath.  Hyperlipidemia Associated symptoms include chest pain (random, unsure about resolving condition). Pertinent negatives include no myalgias or shortness of breath.    HYPERTENSION / HYPERLIPIDEMIA Satisfied with current treatment? yes Duration of hypertension: chronic BP monitoring frequency: a few times a week BP range: avg is low 120/70s  BP medication side effects: yes- dizziness  Past BP meds: amlodipine and benazepril Duration of hyperlipidemia: years Cholesterol medication side effects: no Cholesterol supplements: none Past cholesterol medications: none Medication compliance: excellent compliance Aspirin: no Recent stressors: yes Recurrent headaches: no Visual changes: no Palpitations: no Dyspnea: no Chest pain: yes- feels more like a muscle ache Lower extremity edema: no Dizzy/lightheaded: yes with standing but not every time   He is not sure how much water he drinks per day   Atrial Fibrillation Was seen by Dr. Rockey Situ on 05/27/22  Medications were adjusted accordingly - reviewed notes from this encounter Patient reports this is "a minor inconvenience" but seems to be in sinus rhythm most of the time He is taking the Metoprolol and halved the Amlodipine to help with dizziness   He is exercising - 4-5 mile walk/jog 3 days per week   Patient Active Problem List   Diagnosis Date Noted   A-fib ([place])  06/11/2022   Atypical chest pain 08/01/2021   Chest pain in adult 06/20/2021   Hyperlipidemia 06/20/2021   Elevated low density lipoprotein (LDL) cholesterol level 11/17/2020   Decreased hearing 01/22/2018   Pre-diabetes 03/19/2017   Vitamin D deficiency 02/02/2016   Chronic diarrhea 02/01/2016   Essential hypertension 09/20/2015    Past Surgical History:  Procedure Laterality Date   wisdom teeth removed      Family History  Problem Relation Age of Onset   Parkinson's disease Mother    Hypertension Mother    Alcohol abuse Father    Hypertension Father     Social History   Tobacco Use   Smoking status: Never   Smokeless tobacco: Never  Substance Use Topics   Alcohol use: No     Current Outpatient Medications:    amLODipine (NORVASC) 5 MG tablet, Take 1 tablet (5 mg total) by mouth daily., Disp: 90 tablet, Rfl: 0   benazepril (LOTENSIN) 40 MG tablet, Take 1 tablet (40 mg total) by mouth daily., Disp: 90 tablet, Rfl: 4   metoprolol succinate (TOPROL-XL) 25 MG 24 hr tablet, Take 1 tablet (25 mg total) by mouth daily., Disp: 90 tablet, Rfl: 3   rivaroxaban (XARELTO) 20 MG TABS tablet, Take 1 tablet (20 mg total) by mouth daily with supper., Disp: 30 tablet, Rfl: 0  Allergies  Allergen Reactions   Hct [Hydrochlorothiazide] Other (See Comments)    ED    I personally reviewed active problem list, medication list, allergies, health maintenance, notes from last encounter, notes from last 2 encounters, lab results with the patient/caregiver today.   Review of Systems  Eyes:  Negative for blurred vision and double vision.  Respiratory:  Negative for shortness of breath and wheezing.   Cardiovascular:  Positive for chest pain (random, unsure about resolving condition). Negative for palpitations and leg swelling.  Musculoskeletal:  Negative for back pain and myalgias.  Neurological:  Positive for dizziness. Negative for tingling, tremors, weakness and headaches.       Objective  Vitals:   06/11/22 1023  BP: 112/78  Pulse: (!) 57  Temp: 98.5 F (36.9 C)  TempSrc: Oral  SpO2: 98%  Weight: 223 lb 1.6 oz (101.2 kg)  Height: 5' 9.02" (1.753 m)    Body mass index is 32.93 kg/m.  Physical Exam Vitals reviewed.  Constitutional:      General: He is awake.     Appearance: Normal appearance. He is well-developed and well-groomed.  HENT:     Head: Normocephalic and atraumatic.  Cardiovascular:     Rate and Rhythm: Normal rate and regular rhythm.     Pulses: Normal pulses.          Radial pulses are 2+ on the right side and 2+ on the left side.     Heart sounds: Normal heart sounds. No murmur heard.    No friction rub. No gallop.  Pulmonary:     Effort: Pulmonary effort is normal.     Breath sounds: Normal breath sounds. No decreased air movement. No decreased breath sounds, wheezing, rhonchi or rales.  Musculoskeletal:     Right lower leg: No edema.     Left lower leg: No edema.  Neurological:     General: No focal deficit present.     Mental Status: He is alert and oriented to person, place, and time. Mental status is at baseline.  Psychiatric:        Mood and Affect: Mood normal.        Behavior: Behavior normal. Behavior is cooperative.        Thought Content: Thought content normal.        Judgment: Judgment normal.      Recent Results (from the past 2160 hour(s))  Vitamin D (25 hydroxy)     Status: None   Collection Time: 04/02/22 10:11 AM  Result Value Ref Range   Vit D, 25-Hydroxy 37.2 30.0 - 100.0 ng/mL    Comment: Vitamin D deficiency has been defined by the [place] practice guideline as a level of serum 25-OH vitamin D less than 20 ng/mL (1,2). The Endocrine Society went on to further define vitamin D insufficiency as a level between 21 and 29 ng/mL (2). 1. IOM (Institute of Medicine). 2010. Dietary reference    intakes for calcium and D. [place]: The    [employer]. 2. Holick MF, Binkley Woodmore, Bischoff-Ferrari HA, et al.    Evaluation, treatment, and prevention of vitamin D    deficiency: an Endocrine Society clinical practice    guideline. JCEM. 2011 Jul; 96(7):1911-30.   CBC w/Diff     Status: None   Collection Time: 04/02/22 10:11 AM  Result Value Ref Range   WBC 6.2 3.4 - 10.8 x10E3/uL   RBC 5.24 4.14 - 5.80 x10E6/uL   Hemoglobin 15.3 13.0 - 17.7 g/dL   Hematocrit 46.2 37.5 - 51.0 %   MCV 88 79 - 97 fL   MCH 29.2 26.6 - 33.0 pg   MCHC 33.1 31.5 - 35.7 g/dL   RDW 13.2 11.6 - 15.4 %   Platelets 239 150 -  450 x10E3/uL   Neutrophils 67 Not Estab. %   Lymphs 25 Not Estab. %   Monocytes 7 Not Estab. %   Eos 0 Not Estab. %   Basos 1 Not Estab. %   Neutrophils Absolute 4.2 1.4 - 7.0 x10E3/uL   Lymphocytes Absolute 1.5 0.7 - 3.1 x10E3/uL   Monocytes Absolute 0.4 0.1 - 0.9 x10E3/uL   EOS (ABSOLUTE) 0.0 0.0 - 0.4 x10E3/uL   Basophils Absolute 0.0 0.0 - 0.2 x10E3/uL   Immature Granulocytes 0 Not Estab. %   Immature Grans (Abs) 0.0 0.0 - 0.1 x10E3/uL  Comp Met (CMET)     Status: Abnormal   Collection Time: 04/02/22 10:11 AM  Result Value Ref Range   Glucose 90 70 - 99 mg/dL   BUN 13 6 - 24 mg/dL   Creatinine, Ser 1.29 (H) 0.76 - 1.27 mg/dL   eGFR 66 >59 mL/min/1.73   BUN/Creatinine Ratio 10 9 - 20   Sodium 143 134 - 144 mmol/L   Potassium 4.7 3.5 - 5.2 mmol/L   Chloride 104 96 - 106 mmol/L   CO2 24 20 - 29 mmol/L   Calcium 9.8 8.7 - 10.2 mg/dL   Total Protein 7.1 6.0 - 8.5 g/dL   Albumin 4.8 3.8 - 4.9 g/dL   Globulin, Total 2.3 1.5 - 4.5 g/dL   Albumin/Globulin Ratio 2.1 1.2 - 2.2   Bilirubin Total 0.7 0.0 - 1.2 mg/dL   Alkaline Phosphatase 63 44 - 121 IU/L   AST 23 0 - 40 IU/L   ALT 41 0 - 44 IU/L  HgB A1c     Status: Abnormal   Collection Time: 04/02/22 10:11 AM  Result Value Ref Range   Hgb A1c MFr Bld 5.9 (H) 4.8 - 5.6 %    Comment:          Prediabetes: 5.7 - 6.4          Diabetes: >6.4          Glycemic  control for adults with diabetes: <7.0    Est. average glucose Bld gHb Est-mCnc 123 mg/dL  Lipid panel     Status: Abnormal   Collection Time: 04/02/22 10:11 AM  Result Value Ref Range   Cholesterol, Total 229 (H) 100 - 199 mg/dL   Triglycerides 191 (H) 0 - 149 mg/dL   HDL 49 >39 mg/dL   VLDL Cholesterol Cal 34 5 - 40 mg/dL   LDL Chol Calc (NIH) 146 (H) 0 - 99 mg/dL   Chol/HDL Ratio 4.7 0.0 - 5.0 ratio    Comment:                                   T. Chol/HDL Ratio                                             Men  Women                               1/2 Avg.Risk  3.4    3.3                                   Avg.Risk  5.0  4.4                                2X Avg.Risk  9.6    7.1                                3X Avg.Risk 23.4   11.0   Specimen status report     Status: None   Collection Time: 04/02/22 10:11 AM  Result Value Ref Range   specimen status report Comment     Comment: Written Authorization Written Authorization Written Authorization Received. Authorization received from Haniyah Maciolek 04-04-2022 Logged by Ivar Bury   Basic metabolic panel     Status: Abnormal   Collection Time: 05/04/22  7:03 PM  Result Value Ref Range   Sodium 141 135 - 145 mmol/L   Potassium 3.9 3.5 - 5.1 mmol/L   Chloride 106 98 - 111 mmol/L   CO2 27 22 - 32 mmol/L   Glucose, Bld 102 (H) 70 - 99 mg/dL    Comment: Glucose reference range applies only to samples taken after fasting for at least 8 hours.   BUN 15 6 - 20 mg/dL   Creatinine, Ser 1.17 0.61 - 1.24 mg/dL   Calcium 9.1 8.9 - 10.3 mg/dL   GFR, Estimated >60 >60 mL/min    Comment: (NOTE) Calculated using the CKD-EPI Creatinine Equation (2021)    Anion gap 8 5 - 15    Comment: Performed at [facility], [address] [place], [place] [address]  CBC     Status: None   Collection Time: 05/04/22  7:03 PM  Result Value Ref Range   WBC 8.1 4.0 - 10.5 K/uL   RBC 4.94 4.22 - 5.81 MIL/uL   Hemoglobin 14.5 13.0 - 17.0 g/dL    HCT 44.2 39.0 - 52.0 %   MCV 89.5 80.0 - 100.0 fL   MCH 29.4 26.0 - 34.0 pg   MCHC 32.8 30.0 - 36.0 g/dL   RDW 13.1 11.5 - 15.5 %   Platelets 238 150 - 400 K/uL   nRBC 0.0 0.0 - 0.2 %    Comment: Performed at [facility], [place], [place] [address]  Troponin I (High Sensitivity)     Status: None   Collection Time: 05/04/22  7:03 PM  Result Value Ref Range   Troponin I (High Sensitivity) 5 <18 ng/L    Comment: (NOTE) Elevated high sensitivity troponin I (hsTnI) values and significant  changes across serial measurements may suggest ACS but many other  chronic and acute conditions are known to elevate hsTnI results.  Refer to the "Links" section for chest pain algorithms and additional  guidance. Performed at [facility], [address] [place], [place] [address]   Magnesium     Status: None   Collection Time: 05/04/22  7:03 PM  Result Value Ref Range   Magnesium 2.3 1.7 - 2.4 mg/dL    Comment: Performed at [facility], [address] [place], [place] [address]  D-dimer, quantitative     Status: None   Collection Time: 05/04/22  7:03 PM  Result Value Ref Range   D-Dimer, Quant <0.27 0.00 - 0.50 ug/mL-FEU    Comment: (NOTE) At the manufacturer cut-off value of 0.5 g/mL FEU, this assay has a negative predictive value of 95-100%.This assay is intended for use  in conjunction with a clinical pretest probability (PTP) assessment model to exclude pulmonary embolism (PE) and deep venous thrombosis (DVT) in outpatients suspected of PE or DVT. Results should be correlated with clinical presentation. Performed at [facility], [address] [place], [place] [address]   TSH     Status: None   Collection Time: 05/04/22  7:03 PM  Result Value Ref Range   TSH 1.338 0.350 - 4.500 uIU/mL    Comment: Performed by a 3rd Generation assay with a functional sensitivity of <=0.01 uIU/mL. Performed at [facility], [address] [place], [place] [address]   T4, free     Status: None   Collection Time: 05/04/22  7:03 PM  Result Value Ref Range   Free T4 0.84 0.61 - 1.12 ng/dL    Comment: (NOTE) Biotin ingestion may interfere with free T4 tests. If the results are inconsistent with the TSH level, previous test results, or the clinical presentation, then consider biotin interference. If needed, order repeat testing after stopping biotin. Performed at [facility], [address] [place], [place] [address]   Hepatic function panel     Status: Abnormal   Collection Time: 05/04/22  7:03 PM  Result Value Ref Range   Total Protein 7.1 6.5 - 8.1 g/dL   Albumin 4.1 3.5 - 5.0 g/dL   AST 37 15 - 41 U/L    Comment: HEMOLYSIS AT THIS LEVEL MAY AFFECT RESULT   ALT 31 0 - 44 U/L    Comment: HEMOLYSIS AT THIS LEVEL MAY AFFECT RESULT   Alkaline Phosphatase 53 38 - 126 U/L   Total Bilirubin 1.3 (H) 0.3 - 1.2 mg/dL    Comment: HEMOLYSIS AT THIS LEVEL MAY AFFECT RESULT   Bilirubin, Direct 0.4 (H) 0.0 - 0.2 mg/dL    Comment: HEMOLYSIS AT THIS LEVEL MAY AFFECT RESULT   Indirect Bilirubin 0.9 0.3 - 0.9 mg/dL    Comment: Performed at [facility], [address] [place], [place] [address]  Brain natriuretic peptide     Status: None   Collection Time: 05/04/22  7:03 PM  Result Value Ref Range   B Natriuretic Peptide 18.1 0.0 - 100.0 pg/mL    Comment: Performed at [facility], [place], [place] [address]  Troponin I (High Sensitivity)     Status: None   Collection Time: 05/04/22  8:30 PM  Result Value Ref Range   Troponin I (High Sensitivity) 5 <18 ng/L    Comment: (NOTE) Elevated high sensitivity troponin I (hsTnI) values and significant  changes across serial measurements may suggest ACS but many other  chronic and acute conditions are known to elevate hsTnI results.  Refer to the "Links" section for chest pain algorithms and additional  guidance. Performed at  [facility], [address] [place], [place] [address]      PHQ2/9:    06/11/2022   10:55 AM 04/09/2022   10:13 AM 06/20/2021   11:07 AM 04/27/2020   11:02 AM 03/08/2019    1:14 PM  Depression screen PHQ 2/9  Decreased Interest 0 0 0 0 0  Down, Depressed, Hopeless 0 0 0 0 0  PHQ - 2 Score 0 0 0 0 0  Altered sleeping 0 1 0 1 2  Tired, decreased energy 1 1 0 1 2  Change in appetite 0 1 0 0 1  Feeling bad or failure about yourself  0 0 0 1 0  Trouble concentrating  0 0 0 0 0  Moving slowly or fidgety/restless 0 0 0 0 0  Suicidal thoughts 0 0 0 0 0  PHQ-9 Score 1 3 0 3 5  Difficult doing work/chores Not difficult at all Not difficult at all Not difficult at all  Not difficult at all      Fall Risk:    06/11/2022   10:55 AM 04/09/2022   10:13 AM 08/01/2021   11:14 AM 06/20/2021   11:06 AM 04/27/2020   11:01 AM  Fall Risk   Falls in the past year? 0 0 0 0 0  Number falls in past yr: 0 0 0 0 0  Injury with Fall? 0 0 0 0 0  Risk for fall due to : No Fall Risks No Fall Risks No Fall Risks No Fall Risks   Follow up Falls evaluation completed Falls evaluation completed Falls evaluation completed Falls evaluation completed       Functional Status Survey:      Assessment & Plan  Problem List Items Addressed This Visit       Cardiovascular and Mediastinum   Essential hypertension    Chronic, historic condition He is taking Amlodipine 5 mg PO QD, Benazepril 40 mg PO QD and Metoprolol 25 mg PO QD He was previously seen by Dr. Rockey Situ after being dx with intermittent a fib and was instructed to halve Amlodipine and continue with Metoprolol  He reports he is still having some lightheadedness and dizziness with standing and is curious if Amlodipine could be tapered further or even stopped Reviewed maintaining hydration with him and provided 5 mg Amlodipine so he will not have to cut tabs in half anymore for maintenance dose If lightheadedness is not improving with  hydration efforts after 2-3 weeks, recommend he try to take Amlodipine 2.5 mg PO QD instead  Recommend he monitor BP at home for response to these changes Continue Benazepril 40 mg PO QD for HTN management  Follow up in 3 months for monitoring or sooner if concerns arise       Relevant Medications   amLODipine (NORVASC) 5 MG tablet   A-fib ([place]) - Primary    Acute on chronic, newly diagnosed  Patient is followed by Cardiology who recommended he halve Amlodipine and continue Metoprolol 25 mg PO QD - agree with plan Will provide new script for Amlodipine 5 mg PO QD so he doesn't have to cut tabs in half He is taking Xarelto for anticoagulation maintenance  Recommend continued collaboration and follow up with Cardiology Follow up with PCP office in 3 months for monitoring       Relevant Medications   amLODipine (NORVASC) 5 MG tablet     Other   Hyperlipidemia    Chronic, ongoing Currently not treated with medication Discussed impact that high cholesterol and HTN can have on CVD health  Patient is wary of adding more medications to his regimen as he states he is having a difficult time with taking the ones he is on Recommend starting a low intensity statin when he is more amenable to assist with cholesterol management  Follow up in 3 months       Relevant Medications   amLODipine (NORVASC) 5 MG tablet   Other Visit Diagnoses     Need for shingles vaccine       Relevant Orders   Zoster Recombinant (Shingrix ) (Completed)   Need for influenza vaccination       Relevant Orders   Flu Vaccine  QUAD 1moIM (Fluarix, Fluzone & Alfiuria Quad PF) (Completed)        Return in about 3 months (around 09/11/2022) for HTN, a fib, HLD.   I, Espiridion Supinski E Desirre Eickhoff, PA-C, have reviewed all documentation for this visit. The documentation on 06/11/22 for the exam, diagnosis, procedures, and orders are all accurate and complete.   ETalitha Givens MHS, PA-C [facility]

## 2022-06-26 DIAGNOSIS — H40003 Preglaucoma, unspecified, bilateral: Secondary | ICD-10-CM | POA: Diagnosis not present

## 2022-07-19 ENCOUNTER — Telehealth: Payer: Self-pay | Admitting: Cardiovascular Disease

## 2022-07-19 ENCOUNTER — Other Ambulatory Visit: Payer: Self-pay | Admitting: Physician Assistant

## 2022-07-19 DIAGNOSIS — I10 Essential (primary) hypertension: Secondary | ICD-10-CM

## 2022-07-19 NOTE — Telephone Encounter (Signed)
Call placed to the pharmacy. They stated that they had the Metoprolol ready for the patient. The patient has been made aware.

## 2022-07-19 NOTE — Telephone Encounter (Signed)
Requested medication (s) are due for refill today: Early  Requested medication (s) are on the active medication list: Yes  Last refill:  05/27/22  Dr. Mariah Milling  Future visit scheduled: Yes  Notes to clinic:  See request.    Requested Prescriptions  Pending Prescriptions Disp Refills   metoprolol succinate (TOPROL-XL) 25 MG 24 hr tablet [Pharmacy Med Name: METOPROLOL SUCC ER 25 MG TAB] 10 tablet     Sig: TAKE 1 TABLET (25 MG TOTAL) BY MOUTH DAILY. TAKE 25 MG FOR 7 DAYS THEN TAKE 12.5 MG (HALF TABLET) FOR 7 DAYS. THEN TAKE HALF TABLET EVERY OTHER DAY FOR ONE WEEK. THEN STOP COMPLETELY.     Cardiovascular:  Beta Blockers Passed - 07/19/2022  8:03 AM      Passed - Last BP in normal range    BP Readings from Last 1 Encounters:  06/11/22 112/78         Passed - Last Heart Rate in normal range    Pulse Readings from Last 1 Encounters:  06/11/22 (!) 57         Passed - Valid encounter within last 6 months    Recent Outpatient Visits           1 month ago Atrial fibrillation, unspecified type (HCC)   [facility] Mecum, Erin E, PA-C   3 months ago Encounter for routine history and physical examination of adult   [employer], Erin E, PA-C   11 months ago Atypical chest pain   [facility] Vigg, Avanti, MD   1 year ago Chest pain in adult   [facility] Vigg, Avanti, MD   1 year ago Other chest pain   [facility] Vigg, Avanti, MD       Future Appointments             In 1 month Mecum, Oswaldo Conroy, PA-C [facility]   In 8 months Cannady, Dorie Rank, NP [facility]

## 2022-07-19 NOTE — Telephone Encounter (Signed)
*  STAT* If patient is at the pharmacy, call can be transferred to refill team.   1. Which medications need to be refilled? (please list name of each medication and dose if known)  metoprolol succinate (TOPROL-XL) 25 MG 24 hr tablet  2. Which pharmacy/location (including street and city if local pharmacy) is medication to be sent to? [id number] - [place], [place] - [address]  3. Do they need a 30 day or 90 day supply?  90 day supply  Patient states the pharmacy informed him that they do not have any refills for his Metoprolol and they have attempted to contact our office for refills, but we declined.

## 2022-07-22 ENCOUNTER — Other Ambulatory Visit: Payer: Self-pay

## 2022-07-22 DIAGNOSIS — I10 Essential (primary) hypertension: Secondary | ICD-10-CM

## 2022-07-22 MED ORDER — BENAZEPRIL HCL 40 MG PO TABS: 40.0000 mg | ORAL_TABLET | Freq: Every day | ORAL | 4 refills | Status: DC

## 2022-07-22 NOTE — Telephone Encounter (Signed)
Medication refill for Benazapril 40mg  last ov 06/11/22, upcoming ov 09/11/22 . Please advise

## 2022-07-31 ENCOUNTER — Telehealth: Payer: Self-pay | Admitting: Cardiovascular Disease

## 2022-07-31 NOTE — Telephone Encounter (Signed)
Spoke to patient states appt. Is not needed at this time & will call back to schedule next year.

## 2022-09-08 NOTE — Patient Instructions (Incomplete)

## 2022-09-11 ENCOUNTER — Ambulatory Visit: Payer: BC Managed Care – PPO | Admitting: Nurse Practitioner

## 2022-09-29 NOTE — Patient Instructions (Incomplete)

## 2022-10-01 ENCOUNTER — Ambulatory Visit: Payer: BC Managed Care – PPO | Admitting: Nurse Practitioner

## 2022-10-01 DIAGNOSIS — Z114 Encounter for screening for human immunodeficiency virus [HIV]: Secondary | ICD-10-CM

## 2022-10-01 DIAGNOSIS — R7303 Prediabetes: Secondary | ICD-10-CM

## 2022-10-01 DIAGNOSIS — I10 Essential (primary) hypertension: Secondary | ICD-10-CM

## 2022-10-01 DIAGNOSIS — E559 Vitamin D deficiency, unspecified: Secondary | ICD-10-CM

## 2022-10-01 DIAGNOSIS — N400 Enlarged prostate without lower urinary tract symptoms: Secondary | ICD-10-CM

## 2022-10-01 DIAGNOSIS — E782 Mixed hyperlipidemia: Secondary | ICD-10-CM

## 2022-10-01 DIAGNOSIS — Z23 Encounter for immunization: Secondary | ICD-10-CM

## 2022-10-01 DIAGNOSIS — I480 Paroxysmal atrial fibrillation: Secondary | ICD-10-CM

## 2022-10-01 DIAGNOSIS — Z1159 Encounter for screening for other viral diseases: Secondary | ICD-10-CM

## 2022-12-18 DIAGNOSIS — A09 Infectious gastroenteritis and colitis, unspecified: Secondary | ICD-10-CM | POA: Diagnosis not present

## 2022-12-30 DIAGNOSIS — H5213 Myopia, bilateral: Secondary | ICD-10-CM | POA: Diagnosis not present

## 2022-12-30 DIAGNOSIS — H52223 Regular astigmatism, bilateral: Secondary | ICD-10-CM | POA: Diagnosis not present

## 2022-12-30 DIAGNOSIS — H40003 Preglaucoma, unspecified, bilateral: Secondary | ICD-10-CM | POA: Diagnosis not present

## 2023-04-06 NOTE — Patient Instructions (Signed)
Atrial Fibrillation Atrial fibrillation (AFib) is a type of heartbeat that is irregular or fast. If you have AFib, your heart beats without any order. This makes it hard for your heart to pump blood in a normal way. AFib may come and go, or it may become a long-lasting problem. If AFib is not treated, it can put you at higher risk for stroke, heart failure, and other heart problems. What are the causes? AFib may be caused by diseases that damage the heart's electrical system. They include: High blood pressure. Heart failure. Heart valve diseases. Heart surgery. Diabetes. Thyroid disease. Kidney disease. Lung diseases, such as pneumonia or COPD. Sleep apnea. Sometimes the cause is not known. What increases the risk? You are more likely to develop AFib if: You are older. You exercise often and very hard. You have a family history of AFib. You are male. You are Caucasian. You are overweight. You smoke. You drink a lot of alcohol. What are the signs or symptoms? Common symptoms of this condition include: A feeling that your heart is beating very fast. Chest pain or discomfort. Feeling short of breath. Suddenly feeling light-headed or weak. Getting tired easily during activity. Fainting. Sweating. In some cases, there are no symptoms. How is this treated? Medicines to: Prevent blood clots. Treat heart rate or heart rhythm problems. Using devices, such as a pacemaker, to correct heart rhythm problems. Doing surgery to remove the part of the heart that sends bad signals. Closing an area where clots can form in the heart (left atrial appendage). In some cases, your doctor will treat other underlying conditions. Follow these instructions at home: Medicines Take over-the-counter and prescription medicines only as told by your doctor. Do not take any new medicines without first talking to your doctor. If you are taking blood thinners: Talk with your doctor before taking aspirin  or NSAIDs, such as ibuprofen. Take your medicines as told. Take them at the same time each day. Do not do things that could hurt or bruise you. Be careful to avoid falls. Wear an alert bracelet or carry a card that says you take blood thinners. Lifestyle Do not smoke or use any products that contain nicotine or tobacco. If you need help quitting, ask your doctor. Eat heart-healthy foods. Talk with your doctor about the right eating plan for you. Exercise regularly as told by your doctor. Do not drink alcohol. Lose weight if you are overweight. General instructions If you have sleep apnea, treat it as told by your doctor. Do not use diet pills unless your doctor says they are safe for you. Diet pills may make heart problems worse. Keep all follow-up visits. Your doctor will check your heart rate and rhythm regularly. Contact a doctor if: You notice a change in the speed, rhythm, or strength of your heartbeat. You are taking a blood-thinning medicine and you get more bruising. You get tired more easily when you move or exercise. You have a sudden change in weight. Get help right away if:  You have pain in your chest. You have trouble breathing. You have side effects of blood thinners, such as blood in your vomit, poop (stool), or pee (urine), or bleeding that cannot stop. You have any signs of a stroke. "BE FAST" is an easy way to remember the main warning signs: B - Balance. Dizziness, sudden trouble walking, or loss of balance. E - Eyes. Trouble seeing or a change in how you see. F - Face. Sudden weakness or loss of  feeling in the face. The face or eyelid may droop on one side. A - Arms.Weakness or loss of feeling in an arm. This happens suddenly and usually on one side of the body. S - Speech. Sudden trouble speaking, slurred speech, or trouble understanding what people say. T - Time.Time to call emergency services. Write down what time symptoms started. You have other signs of a  stroke, such as: A sudden, very bad headache with no known cause. Feeling like you may vomit (nausea). Vomiting. A seizure. These symptoms may be an emergency. Get help right away. Call 911. Do not wait to see if the symptoms will go away. Do not drive yourself to the hospital. This information is not intended to replace advice given to you by your health care provider. Make sure you discuss any questions you have with your health care provider. Document Revised: 04/24/2022 Document Reviewed: 04/24/2022 Elsevier Patient Education  2024 [employer].

## 2023-04-11 ENCOUNTER — Encounter: Payer: Self-pay | Admitting: Nurse Practitioner

## 2023-04-11 ENCOUNTER — Ambulatory Visit (INDEPENDENT_AMBULATORY_CARE_PROVIDER_SITE_OTHER): Payer: BC Managed Care – PPO | Admitting: Nurse Practitioner

## 2023-04-11 VITALS — BP 116/78 | HR 68 | Temp 97.7°F | Resp 18 | Ht 69.5 in | Wt 217.2 lb

## 2023-04-11 DIAGNOSIS — N400 Enlarged prostate without lower urinary tract symptoms: Secondary | ICD-10-CM

## 2023-04-11 DIAGNOSIS — Z0000 Encounter for general adult medical examination without abnormal findings: Secondary | ICD-10-CM

## 2023-04-11 DIAGNOSIS — R7303 Prediabetes: Secondary | ICD-10-CM | POA: Diagnosis not present

## 2023-04-11 DIAGNOSIS — Z1159 Encounter for screening for other viral diseases: Secondary | ICD-10-CM

## 2023-04-11 DIAGNOSIS — Z114 Encounter for screening for human immunodeficiency virus [HIV]: Secondary | ICD-10-CM | POA: Diagnosis not present

## 2023-04-11 DIAGNOSIS — E782 Mixed hyperlipidemia: Secondary | ICD-10-CM

## 2023-04-11 DIAGNOSIS — I480 Paroxysmal atrial fibrillation: Secondary | ICD-10-CM | POA: Diagnosis not present

## 2023-04-11 DIAGNOSIS — I10 Essential (primary) hypertension: Secondary | ICD-10-CM | POA: Diagnosis not present

## 2023-04-11 DIAGNOSIS — K529 Noninfective gastroenteritis and colitis, unspecified: Secondary | ICD-10-CM

## 2023-04-11 DIAGNOSIS — E559 Vitamin D deficiency, unspecified: Secondary | ICD-10-CM

## 2023-04-11 DIAGNOSIS — Z6831 Body mass index (BMI) 31.0-31.9, adult: Secondary | ICD-10-CM | POA: Insufficient documentation

## 2023-04-11 DIAGNOSIS — Z23 Encounter for immunization: Secondary | ICD-10-CM

## 2023-04-11 LAB — MICROALBUMIN, URINE WAIVED
Creatinine, Urine Waived: 300 mg/dL (ref 10–300)
Microalb, Ur Waived: 30 mg/L — ABNORMAL HIGH (ref 0–19)
Microalb/Creat Ratio: <30 mg/g (ref <30)

## 2023-04-11 MED ORDER — METOPROLOL SUCCINATE ER 25 MG PO TB24
25.0000 mg | ORAL_TABLET | Freq: Every day | ORAL | 4 refills | Status: DC
Start: 2023-04-11 — End: 2024-04-27

## 2023-04-11 MED ORDER — BENAZEPRIL HCL 40 MG PO TABS
40.0000 mg | ORAL_TABLET | Freq: Every day | ORAL | 4 refills | Status: DC
Start: 2023-04-11 — End: 2023-10-14

## 2023-04-11 NOTE — Assessment & Plan Note (Signed)
Ongoing, recommend continue Vitamin D3 2000 units daily.  Recheck level today.

## 2023-04-11 NOTE — Assessment & Plan Note (Signed)
Ongoing with A1c 5.9% last visit, recheck today.  Urine ALB 30 in past, continue Benazepril for kidney protection.  Continue diet focus at this time. Recheck level today.

## 2023-04-11 NOTE — Assessment & Plan Note (Signed)
Chronic, stable .  BP well below goal in office today.  He would like to start minimizing medications.  Will stop Amlodipine, is at lower dose.  Continue Metoprolol and Benazepril at this time.  Recommend he monitor BP at least a few mornings a week at home and document.  DASH diet at home.  Labs today: CBC, CMP, TSH, Lipid, Urine ALB.  Return in 6 months, he will alert provider if BP trends up and then would restart Amlodipine.

## 2023-04-11 NOTE — Assessment & Plan Note (Signed)
BMI 31.61.  Recommended eating smaller high protein, low fat meals more frequently and exercising 30 mins a day 5 times a week with a goal of 10-15lb weight loss in the next 3 months. Patient voiced their understanding and motivation to adhere to these recommendations.

## 2023-04-11 NOTE — Assessment & Plan Note (Signed)
Chronic, ongoing.  No current medications, although would be beneficial.  Prefers to keep medications low.  Lipid panel today and continue to recommend statin therapy. The 10-year ASCVD risk score (Arnett DK, et al., 2019) is: 9.6%   Values used to calculate the score:     Age: 57 years     Sex: Male     Is Non-Hispanic African American: Yes     Diabetic: No     Tobacco smoker: No     Systolic Blood Pressure: 116 mmHg     Is BP treated: Yes     HDL Cholesterol: 49 mg/dL     Total Cholesterol: 229 mg/dL

## 2023-04-11 NOTE — Progress Notes (Signed)
BP 116/78 (BP Location: Left Arm, Patient Position: Sitting, Cuff Size: Normal)   Pulse 68   Temp 97.7 F (36.5 C) (Oral)   Resp 18   Ht 5' 9.5" (1.765 m)   Wt 217 lb 3.2 oz (98.5 kg)   SpO2 100%   BMI 31.61 kg/m    Subjective:    Patient ID: Harold [name], male    DOB: 04-18-1966, 57 y.o.   MRN: [id number]  HPI: Harold [name] is a 57 y.o. male presenting on 04/11/2023 for comprehensive medical examination. Current medical complaints include:none  He currently lives with: wife Interim Problems from his last visit: no   HYPERTENSION / HYPERLIPIDEMIA Continues on Amlodipine, Losartan, and Metoprolol.  Is not taking Xarelto, last cardiology visit it was reported he was missing lots of doses with this.  He currently takes medication only on weekends.  Continues on Vitamin D at home for low levels. Satisfied with current treatment? yes Duration of hypertension: chronic BP monitoring frequency: a few times a week BP range: consistent with what we got today BP medication side effects: no Duration of hyperlipidemia: chronic Cholesterol supplements: none Aspirin: no Recent stressors: no Recurrent headaches: no Visual changes: no Palpitations: occasional, brief -- checks and no a-fib present Dyspnea: no Chest pain: occasional, brief Lower extremity edema: no Dizzy/lightheaded: no  The 10-year ASCVD risk score (Arnett DK, et al., 2019) is: 9.6%   Values used to calculate the score:     Age: 71 years     Sex: Male     Is Non-Hispanic African American: Yes     Diabetic: No     Tobacco smoker: No     Systolic Blood Pressure: 116 mmHg     Is BP treated: Yes     HDL Cholesterol: 49 mg/dL     Total Cholesterol: 229 mg/dL   ATRIAL FIBRILLATION Atrial fibrillation status: stable Satisfied with current treatment: yes  Medication side effects:  no Medication compliance: good compliance Etiology of atrial fibrillation:  Palpitations:  as above Chest pain:  as  above Dyspnea on exertion:  no Orthopnea:  no Syncope:  no Edema:  no Ventricular rate control: B-blocker Anti-coagulation:  none    PREDIABETES A1c August last year was 5.9%. Polydipsia/polyuria: no Visual disturbance: no Chest pain: as above Paresthesias: no  Functional Status Survey: Is the patient deaf or have difficulty hearing?: No Does the patient have difficulty seeing, even when wearing glasses/contacts?: No Does the patient have difficulty concentrating, remembering, or making decisions?: No Does the patient have difficulty walking or climbing stairs?: No Does the patient have difficulty dressing or bathing?: No Does the patient have difficulty doing errands alone such as visiting a doctor's office or shopping?: No  FALL RISK:    04/11/2023   10:00 AM 06/11/2022   10:55 AM 04/09/2022   10:13 AM 08/01/2021   11:14 AM 06/20/2021   11:06 AM  Fall Risk   Falls in the past year? 0 0 0 0 0  Number falls in past yr: 0 0 0 0 0  Injury with Fall? 0 0 0 0 0  Risk for fall due to : No Fall Risks No Fall Risks No Fall Risks No Fall Risks No Fall Risks  Follow up Education provided Falls evaluation completed Falls evaluation completed Falls evaluation completed Falls evaluation completed    Depression Screen    04/11/2023    9:59 AM 06/11/2022   10:55 AM 04/09/2022   10:13 AM  06/20/2021   11:07 AM 04/27/2020   11:02 AM  Depression screen PHQ 2/9  Decreased Interest 1 0 0 0 0  Down, Depressed, Hopeless 1 0 0 0 0  PHQ - 2 Score 2 0 0 0 0  Altered sleeping 0 0 1 0 1  Tired, decreased energy 0 1 1 0 1  Change in appetite 0 0 1 0 0  Feeling bad or failure about yourself  1 0 0 0 1  Trouble concentrating 0 0 0 0 0  Moving slowly or fidgety/restless 0 0 0 0 0  Suicidal thoughts 0 0 0 0 0  PHQ-9 Score 3 1 3  0 3  Difficult doing work/chores Not difficult at all Not difficult at all Not difficult at all Not difficult at all       04/11/2023    9:59 AM 06/11/2022   10:55 AM  04/09/2022   10:13 AM 06/20/2021   11:07 AM  GAD 7 : Generalized Anxiety Score  Nervous, Anxious, on Edge 1 0 0 0  Control/stop worrying 0 1 0 0  Worry too much - different things 0 1 0 0  Trouble relaxing 0 0 0 0  Restless 0 0 0 0  Easily annoyed or irritable 0 0 0 0  Afraid - awful might happen 0 1 0 0  Total GAD 7 Score 1 3 0 0  Anxiety Difficulty Not difficult at all Not difficult at all Not difficult at all Not difficult at all   Advanced Directives <no information>  Past Medical History:  Past Medical History:  Diagnosis Date   Hyperlipidemia    Hypertension     Surgical History:  Past Surgical History:  Procedure Laterality Date   wisdom teeth removed      Medications:  No current outpatient medications on file prior to visit.   No current facility-administered medications on file prior to visit.    Allergies:  Allergies  Allergen Reactions   Hct [Hydrochlorothiazide] Other (See Comments)    ED    Social History:  Social History   Socioeconomic History   Marital status: Married    Spouse name: Not on file   Number of children: Not on file   Years of education: Not on file   Highest education level: Not on file  Occupational History   Not on file  Tobacco Use   Smoking status: Never   Smokeless tobacco: Never  Vaping Use   Vaping status: Never Used  Substance and Sexual Activity   Alcohol use: No   Drug use: No   Sexual activity: Not on file  Other Topics Concern   Not on file  Social History Narrative   Not on file   Social Determinants of Health   Financial Resource Strain: Not on file  Food Insecurity: Not on file  Transportation Needs: Not on file  Physical Activity: Not on file  Stress: Not on file  Social Connections: Not on file  Intimate Partner Violence: Not on file   Social History   Tobacco Use  Smoking Status Never  Smokeless Tobacco Never   Social History   Substance and Sexual Activity  Alcohol Use No     Family History:  Family History  Problem Relation Age of Onset   Parkinson's disease Mother    Hypertension Mother    Alcohol abuse Father    Hypertension Father     Past medical history, surgical history, medications, allergies, family history and social history reviewed with  patient today and changes made to appropriate areas of the chart.   Review of Systems - negative All other ROS negative except what is listed above and in the HPI.      Objective:    BP 116/78 (BP Location: Left Arm, Patient Position: Sitting, Cuff Size: Normal)   Pulse 68   Temp 97.7 F (36.5 C) (Oral)   Resp 18   Ht 5' 9.5" (1.765 m)   Wt 217 lb 3.2 oz (98.5 kg)   SpO2 100%   BMI 31.61 kg/m   Wt Readings from Last 3 Encounters:  04/11/23 217 lb 3.2 oz (98.5 kg)  06/11/22 223 lb 1.6 oz (101.2 kg)  05/27/22 225 lb 4 oz (102.2 kg)    Physical Exam Vitals and nursing note reviewed.  Constitutional:      General: He is awake. He is not in acute distress.    Appearance: He is well-developed and well-groomed. He is obese. He is not ill-appearing.  HENT:     Head: Normocephalic and atraumatic.     Right Ear: Hearing, tympanic membrane, ear canal and external ear normal. No drainage.     Left Ear: Hearing, tympanic membrane, ear canal and external ear normal. No drainage.     Nose: Nose normal.     Mouth/Throat:     Pharynx: Uvula midline.  Eyes:     General: Lids are normal.        Right eye: No discharge.        Left eye: No discharge.     Extraocular Movements: Extraocular movements intact.     Conjunctiva/sclera: Conjunctivae normal.     Pupils: Pupils are equal, round, and reactive to light.     Visual Fields: Right eye visual fields normal and left eye visual fields normal.  Neck:     Thyroid: No thyromegaly.     Vascular: No carotid bruit or JVD.     Trachea: Trachea normal.  Cardiovascular:     Rate and Rhythm: Normal rate and regular rhythm.     Heart sounds: Normal heart  sounds, S1 normal and S2 normal. No murmur heard.    No gallop.  Pulmonary:     Effort: Pulmonary effort is normal. No accessory muscle usage or respiratory distress.     Breath sounds: Normal breath sounds.  Abdominal:     General: Bowel sounds are normal.     Palpations: Abdomen is soft. There is no hepatomegaly or splenomegaly.     Tenderness: There is no abdominal tenderness.  Musculoskeletal:        General: Normal range of motion.     Cervical back: Normal range of motion and neck supple.     Right lower leg: No edema.     Left lower leg: No edema.  Lymphadenopathy:     Head:     Right side of head: No submental, submandibular, tonsillar, preauricular or posterior auricular adenopathy.     Left side of head: No submental, submandibular, tonsillar, preauricular or posterior auricular adenopathy.     Cervical: No cervical adenopathy.  Skin:    General: Skin is warm and dry.     Capillary Refill: Capillary refill takes less than 2 seconds.     Findings: No rash.  Neurological:     Mental Status: He is alert and oriented to person, place, and time.     Gait: Gait is intact.     Deep Tendon Reflexes: Reflexes are normal and symmetric.  Reflex Scores:      Brachioradialis reflexes are 2+ on the right side and 2+ on the left side.      Patellar reflexes are 2+ on the right side and 2+ on the left side. Psychiatric:        Attention and Perception: Attention normal.        Mood and Affect: Mood normal.        Speech: Speech normal.        Behavior: Behavior normal. Behavior is cooperative.        Thought Content: Thought content normal.        Cognition and Memory: Cognition normal.        Judgment: Judgment normal.     Results for orders placed or performed during the hospital encounter of 05/04/22  Basic metabolic panel  Result Value Ref Range   Sodium 141 135 - 145 mmol/L   Potassium 3.9 3.5 - 5.1 mmol/L   Chloride 106 98 - 111 mmol/L   CO2 27 22 - 32 mmol/L    Glucose, Bld 102 (H) 70 - 99 mg/dL   BUN 15 6 - 20 mg/dL   Creatinine, Ser 4.03 0.61 - 1.24 mg/dL   Calcium 9.1 8.9 - 47.4 mg/dL   GFR, Estimated >25 >95 mL/min   Anion gap 8 5 - 15  CBC  Result Value Ref Range   WBC 8.1 4.0 - 10.5 K/uL   RBC 4.94 4.22 - 5.81 MIL/uL   Hemoglobin 14.5 13.0 - 17.0 g/dL   HCT 63.8 75.6 - 43.3 %   MCV 89.5 80.0 - 100.0 fL   MCH 29.4 26.0 - 34.0 pg   MCHC 32.8 30.0 - 36.0 g/dL   RDW 29.5 18.8 - 41.6 %   Platelets 238 150 - 400 K/uL   nRBC 0.0 0.0 - 0.2 %  Magnesium  Result Value Ref Range   Magnesium 2.3 1.7 - 2.4 mg/dL  D-dimer, quantitative  Result Value Ref Range   D-Dimer, Quant <0.27 0.00 - 0.50 ug/mL-FEU  TSH  Result Value Ref Range   TSH 1.338 0.350 - 4.500 uIU/mL  T4, free  Result Value Ref Range   Free T4 0.84 0.61 - 1.12 ng/dL  Hepatic function panel  Result Value Ref Range   Total Protein 7.1 6.5 - 8.1 g/dL   Albumin 4.1 3.5 - 5.0 g/dL   AST 37 15 - 41 U/L   ALT 31 0 - 44 U/L   Alkaline Phosphatase 53 38 - 126 U/L   Total Bilirubin 1.3 (H) 0.3 - 1.2 mg/dL   Bilirubin, Direct 0.4 (H) 0.0 - 0.2 mg/dL   Indirect Bilirubin 0.9 0.3 - 0.9 mg/dL  Brain natriuretic peptide  Result Value Ref Range   B Natriuretic Peptide 18.1 0.0 - 100.0 pg/mL  Troponin I (High Sensitivity)  Result Value Ref Range   Troponin I (High Sensitivity) 5 <18 ng/L  Troponin I (High Sensitivity)  Result Value Ref Range   Troponin I (High Sensitivity) 5 <18 ng/L      Assessment & Plan:   Problem List Items Addressed This Visit       Cardiovascular and Mediastinum   Essential hypertension    Chronic, stable .  BP well below goal in office today.  He would like to start minimizing medications.  Will stop Amlodipine, is at lower dose.  Continue Metoprolol and Benazepril at this time.  Recommend he monitor BP at least a few mornings a week at home and  document.  DASH diet at home.  Labs today: CBC, CMP, TSH, Lipid, Urine ALB.  Return in 6 months, he will  alert provider if BP trends up and then would restart Amlodipine.       Relevant Medications   metoprolol succinate (TOPROL-XL) 25 MG 24 hr tablet   benazepril (LOTENSIN) 40 MG tablet   Other Relevant Orders   CBC with Differential/Platelet   TSH   Microalbumin, Urine Waived   Paroxysmal atrial fibrillation (HCC) - Primary    Chronic, stable.  HR regular today.  He would like to come off Metoprolol due to his wife and his concern for weight gain.  Feels his HR has been regular for some time.  Discussed with him that Metoprolol is most likely keeping HR regular and would not recommend stopping.  He has stopped his Xarelto.  Recommend he reach out to Dr. Mariah Milling for his recommendation on this as well.  Discussed at length risks with a-fib.      Relevant Medications   metoprolol succinate (TOPROL-XL) 25 MG 24 hr tablet   benazepril (LOTENSIN) 40 MG tablet     Other   Adult BMI 31.0-31.9 kg/sq m    BMI 31.61.  Recommended eating smaller high protein, low fat meals more frequently and exercising 30 mins a day 5 times a week with a goal of 10-15lb weight loss in the next 3 months. Patient voiced their understanding and motivation to adhere to these recommendations.       Mixed hyperlipidemia    Chronic, ongoing.  No current medications, although would be beneficial.  Prefers to keep medications low.  Lipid panel today and continue to recommend statin therapy. The 10-year ASCVD risk score (Arnett DK, et al., 2019) is: 9.6%   Values used to calculate the score:     Age: 33 years     Sex: Male     Is Non-Hispanic African American: Yes     Diabetic: No     Tobacco smoker: No     Systolic Blood Pressure: 116 mmHg     Is BP treated: Yes     HDL Cholesterol: 49 mg/dL     Total Cholesterol: 229 mg/dL       Relevant Medications   metoprolol succinate (TOPROL-XL) 25 MG 24 hr tablet   benazepril (LOTENSIN) 40 MG tablet   Other Relevant Orders   Comprehensive metabolic panel   Lipid Panel  w/o Chol/HDL Ratio   Pre-diabetes    Ongoing with A1c 5.9% last visit, recheck today.  Urine ALB 30 in past, continue Benazepril for kidney protection.  Continue diet focus at this time. Recheck level today.      Relevant Orders   HgB A1c   Vitamin D deficiency    Ongoing, recommend continue Vitamin D3 2000 units daily.  Recheck level today.        Relevant Orders   VITAMIN D 25 Hydroxy (Vit-D Deficiency, Fractures)   Other Visit Diagnoses     Benign prostatic hyperplasia without lower urinary tract symptoms       PSA on labs today.   Relevant Orders   PSA   Need for hepatitis C screening test       Hep C screening due, discussed with patient and obtained today.   Relevant Orders   Hepatitis C antibody   Encounter for screening for HIV       HIV screening due, discussed with patient and obtained today.   Relevant Orders   HIV  Antibody (routine testing w rflx)   Encounter for annual physical exam       Annual physical today with labs and health maintenance reviewed, discussed with patient.       Discussed aspirin prophylaxis for myocardial infarction prevention and decision was it was not indicated  LABORATORY TESTING:  Health maintenance labs ordered today as discussed above.   The natural history of prostate cancer and ongoing controversy regarding screening and potential treatment outcomes of prostate cancer has been discussed with the patient. The meaning of a false positive PSA and a false negative PSA has been discussed. He indicates understanding of the limitations of this screening test and wishes to proceed with screening PSA testing.   IMMUNIZATIONS:   - Tdap: Tetanus vaccination status reviewed: next visit he will obtain - Influenza: Not Up To Date -- refuses today - Pneumovax: Not applicable - Prevnar: Not applicable - Zostavax vaccine: Refused  SCREENING: - Colonoscopy: Up to date  Discussed with patient purpose of the colonoscopy is to detect colon  cancer at curable precancerous or early stages   - AAA Screening: Not applicable  -Hearing Test: Not applicable  -Spirometry: Not applicable   PATIENT COUNSELING:    Sexuality: Discussed sexually transmitted diseases, partner selection, use of condoms, avoidance of unintended pregnancy  and contraceptive alternatives.   Advised to avoid cigarette smoking.  I discussed with the patient that most people either abstain from alcohol or drink within safe limits (<=14/week and <=4 drinks/occasion for males, <=7/weeks and <= 3 drinks/occasion for females) and that the risk for alcohol disorders and other health effects rises proportionally with the number of drinks per week and how often a drinker exceeds daily limits.  Discussed cessation/primary prevention of drug use and availability of treatment for abuse.   Diet: Encouraged to adjust caloric intake to maintain  or achieve ideal body weight, to reduce intake of dietary saturated fat and total fat, to limit sodium intake by avoiding high sodium foods and not adding table salt, and to maintain adequate dietary potassium and calcium preferably from fresh fruits, vegetables, and low-fat dairy products.    stressed the importance of regular exercise  Injury prevention: Discussed safety belts, safety helmets, smoke detector, smoking near bedding or upholstery.   Dental health: Discussed importance of regular tooth brushing, flossing, and dental visits.   Follow up plan: NEXT PREVENTATIVE PHYSICAL DUE IN 1 YEAR. Return in about 6 months (around 10/12/2023) for HTN/HLD, A-FIB.

## 2023-04-11 NOTE — Assessment & Plan Note (Signed)
Chronic, stable.  HR regular today.  He would like to come off Metoprolol due to his wife and his concern for weight gain.  Feels his HR has been regular for some time.  Discussed with him that Metoprolol is most likely keeping HR regular and would not recommend stopping.  He has stopped his Xarelto.  Recommend he reach out to Dr. Mariah Milling for his recommendation on this as well.  Discussed at length risks with a-fib.

## 2023-04-12 LAB — LIPID PANEL W/O CHOL/HDL RATIO
Cholesterol, Total: 231 mg/dL — ABNORMAL HIGH (ref 100–199)
HDL: 59 mg/dL (ref >39)
LDL Chol Calc (NIH): 148 mg/dL — ABNORMAL HIGH (ref 0–99)
Triglycerides: 136 mg/dL (ref 0–149)
VLDL Cholesterol Cal: 24 mg/dL (ref 5–40)

## 2023-04-12 LAB — TSH: TSH: 0.881 u[IU]/mL (ref 0.450–4.500)

## 2023-04-12 LAB — COMPREHENSIVE METABOLIC PANEL
ALT: 37 IU/L (ref 0–44)
AST: 36 IU/L (ref 0–40)
Albumin: 4.6 g/dL (ref 3.8–4.9)
Alkaline Phosphatase: 69 IU/L (ref 44–121)
BUN/Creatinine Ratio: 14 (ref 9–20)
BUN: 16 mg/dL (ref 6–24)
Bilirubin Total: 0.9 mg/dL (ref 0.0–1.2)
CO2: 25 mmol/L (ref 20–29)
Calcium: 9.9 mg/dL (ref 8.7–10.2)
Chloride: 104 mmol/L (ref 96–106)
Creatinine, Ser: 1.17 mg/dL (ref 0.76–1.27)
Globulin, Total: 2.4 g/dL (ref 1.5–4.5)
Glucose: 80 mg/dL (ref 70–99)
Potassium: 4.6 mmol/L (ref 3.5–5.2)
Sodium: 144 mmol/L (ref 134–144)
Total Protein: 7 g/dL (ref 6.0–8.5)
eGFR: 74 mL/min/{1.73_m2} (ref >59)

## 2023-04-12 LAB — CBC WITH DIFFERENTIAL/PLATELET
Basophils Absolute: 0 10*3/uL (ref 0.0–0.2)
Basos: 0 %
EOS (ABSOLUTE): 0 10*3/uL (ref 0.0–0.4)
Eos: 0 %
Hematocrit: 48.1 % (ref 37.5–51.0)
Hemoglobin: 15.5 g/dL (ref 13.0–17.7)
Immature Grans (Abs): 0 10*3/uL (ref 0.0–0.1)
Immature Granulocytes: 0 %
Lymphocytes Absolute: 1.6 10*3/uL (ref 0.7–3.1)
Lymphs: 22 %
MCH: 28.5 pg (ref 26.6–33.0)
MCHC: 32.2 g/dL (ref 31.5–35.7)
MCV: 89 fL (ref 79–97)
Monocytes Absolute: 0.5 10*3/uL (ref 0.1–0.9)
Monocytes: 7 %
Neutrophils Absolute: 5.1 10*3/uL (ref 1.4–7.0)
Neutrophils: 71 %
Platelets: 220 10*3/uL (ref 150–450)
RBC: 5.43 x10E6/uL (ref 4.14–5.80)
RDW: 13.5 % (ref 11.6–15.4)
WBC: 7.3 10*3/uL (ref 3.4–10.8)

## 2023-04-12 LAB — HIV ANTIBODY (ROUTINE TESTING W REFLEX): HIV Screen 4th Generation wRfx: NONREACTIVE

## 2023-04-12 LAB — HEMOGLOBIN A1C
Est. average glucose Bld gHb Est-mCnc: 126 mg/dL
Hgb A1c MFr Bld: 6 % — ABNORMAL HIGH (ref 4.8–5.6)

## 2023-04-12 LAB — VITAMIN D 25 HYDROXY (VIT D DEFICIENCY, FRACTURES): Vit D, 25-Hydroxy: 35.9 ng/mL (ref 30.0–100.0)

## 2023-04-12 LAB — PSA: Prostate Specific Ag, Serum: 0.6 ng/mL (ref 0.0–4.0)

## 2023-04-12 NOTE — Progress Notes (Signed)
Contacted via MyChart  Good morning Bienvenido, your labs have returned: - Kidney function, creatinine and eGFR, remains normal, as is liver function, AST and ALT.  - CBC show no anemia or infection. - Thyroid level and prostate labs are normal + Vitamin D normal. - HIV testing is negative. - A1c is 6% remaining in prediabetic range, definitely continue to focus on diet changes and regular activity to help prevent this from entering diabetic range of 6.5% or greater. - Your cholesterol levels remain elevated and ASCVD risk score above 7%, I will post this below.  I would recommend starting statin therapy to help in preventing stroke or heart attack.  Would you like to try this?  Let me know.  Any questions? The 10-year ASCVD risk score (Arnett DK, et al., 2019) is: 9.1%   Values used to calculate the score:     Age: 57 years     Sex: Male     Is Non-Hispanic African American: Yes     Diabetic: No     Tobacco smoker: No     Systolic Blood Pressure: 116 mmHg     Is BP treated: Yes     HDL Cholesterol: 59 mg/dL     Total Cholesterol: 231 mg/dL Keep being stellar!!  Thank you for allowing me to participate in your care.  I appreciate you. Kindest regards, Tylene Quashie

## 2023-04-16 LAB — HEPATITIS C ANTIBODY: Hep C Virus Ab: NONREACTIVE

## 2023-04-16 LAB — SPECIMEN STATUS REPORT

## 2023-09-13 NOTE — Patient Instructions (Signed)

## 2023-09-16 ENCOUNTER — Ambulatory Visit (INDEPENDENT_AMBULATORY_CARE_PROVIDER_SITE_OTHER): Payer: 59 | Admitting: Nurse Practitioner

## 2023-09-16 VITALS — BP 148/80 | HR 71 | Temp 98.0°F | Ht 69.5 in | Wt 222.2 lb

## 2023-09-16 DIAGNOSIS — J069 Acute upper respiratory infection, unspecified: Secondary | ICD-10-CM | POA: Diagnosis not present

## 2023-09-16 DIAGNOSIS — I10 Essential (primary) hypertension: Secondary | ICD-10-CM

## 2023-09-16 MED ORDER — AMLODIPINE BESYLATE 2.5 MG PO TABS: 2.5000 mg | ORAL_TABLET | Freq: Every day | ORAL | 1 refills | Status: DC

## 2023-09-16 NOTE — Progress Notes (Signed)
BP (!) 148/80 (BP Location: Left Arm, Patient Position: Sitting, Cuff Size: Normal)   Pulse 71   Temp 98 F (36.7 C) (Oral)   Ht 5' 9.5" (1.765 m)   Wt 222 lb 3.2 oz (100.8 kg)   SpO2 98%   BMI 32.34 kg/m    Subjective:    Patient ID: Harold [name], male    DOB: [birth year], 58 y.o.   MRN: [id number]  HPI: Harold [name] is a 58 y.o. male  Chief Complaint  Patient presents with   URI    Patient states he was having some body aches, runny nose, and cough since Friday and over the weekend. States he is feeling better now but still have a little bit of a runny nose   Hypertension    Patient states he has been having elevated BP readings since last Monday   HYPERTENSION  Continues on Benazepril 40 MG daily and Metoprolol XL 25 MG daily as ordered.  Feels BP had been trending up.  This started when he was not feeling well. Was in ER on 09/11/23 for this -- all labs were reassuring and was discharged. Last saw cardiology on 05/27/22. Hypertension status:  trending up   Satisfied with current treatment? yes Duration of hypertension: chronic BP monitoring frequency:  daily BP range: 118/73 to 172/112 (average 144/89) BP medication side effects:  no Medication compliance: good compliance Aspirin: no Recurrent headaches: no Visual changes: no Palpitations: no Dyspnea: no Chest pain: no Lower extremity edema: no Dizzy/lightheaded: no   UPPER RESPIRATORY TRACT INFECTION On Saturday started to feel ill, every in house was positive for Covid and he tested negative.  Currently feeling better. Fever:  at the time Cough: yes Shortness of breath: no Wheezing:  a little Chest pain: no Chest tightness: no Chest congestion: no Nasal congestion: yes Runny nose: yes Post nasal drip: yes Sneezing: no Sore throat: no Swollen glands: no Sinus pressure: yes Headache: yes Face pain: no Toothache: no Ear pain: none Ear pressure: none Eyes red/itching:no Eye  drainage/crusting: no  Vomiting: no Rash: no Fatigue: yes Sick contacts: yes Strep contacts: no  Context: better Recurrent sinusitis: no Relief with OTC cold/cough medications: yes  Treatments attempted: taken Nyquil twice, yesterday and Sunday night    Relevant past medical, surgical, family and social history reviewed and updated as indicated. Interim medical history since our last visit reviewed. Allergies and medications reviewed and updated.  Review of Systems  Constitutional:  Negative for activity change, diaphoresis, fatigue and fever.  Respiratory:  Negative for cough, chest tightness, shortness of breath and wheezing.   Cardiovascular:  Negative for chest pain, palpitations and leg swelling.  Gastrointestinal: Negative.   Neurological: Negative.   Psychiatric/Behavioral: Negative.      Per HPI unless specifically indicated above     Objective:    BP (!) 148/80 (BP Location: Left Arm, Patient Position: Sitting, Cuff Size: Normal)   Pulse 71   Temp 98 F (36.7 C) (Oral)   Ht 5' 9.5" (1.765 m)   Wt 222 lb 3.2 oz (100.8 kg)   SpO2 98%   BMI 32.34 kg/m   Wt Readings from Last 3 Encounters:  09/16/23 222 lb 3.2 oz (100.8 kg)  04/11/23 217 lb 3.2 oz (98.5 kg)  06/11/22 223 lb 1.6 oz (101.2 kg)    Physical Exam Vitals and nursing note reviewed.  Constitutional:      General: He is awake. He is not in acute distress.  Appearance: He is well-developed and well-groomed. He is obese. He is not ill-appearing or toxic-appearing.  HENT:     Head: Normocephalic.     Right Ear: Hearing and external ear normal.     Left Ear: Hearing and external ear normal.  Eyes:     General: Lids are normal.     Extraocular Movements: Extraocular movements intact.     Conjunctiva/sclera: Conjunctivae normal.  Neck:     Thyroid: No thyromegaly.     Vascular: No carotid bruit.  Cardiovascular:     Rate and Rhythm: Normal rate and regular rhythm.     Heart sounds: Normal heart  sounds. No murmur heard.    No gallop.  Pulmonary:     Effort: No accessory muscle usage or respiratory distress.     Breath sounds: Normal breath sounds.  Abdominal:     General: Bowel sounds are normal. There is no distension.     Palpations: Abdomen is soft.     Tenderness: There is no abdominal tenderness.  Musculoskeletal:     Cervical back: Full passive range of motion without pain.     Right lower leg: No edema.     Left lower leg: No edema.  Lymphadenopathy:     Cervical: No cervical adenopathy.  Skin:    General: Skin is warm.     Capillary Refill: Capillary refill takes less than 2 seconds.  Neurological:     Mental Status: He is alert and oriented to person, place, and time.     Deep Tendon Reflexes: Reflexes are normal and symmetric.     Reflex Scores:      Brachioradialis reflexes are 2+ on the right side and 2+ on the left side.      Patellar reflexes are 2+ on the right side and 2+ on the left side. Psychiatric:        Attention and Perception: Attention normal.        Mood and Affect: Mood normal.        Speech: Speech normal.        Behavior: Behavior normal. Behavior is cooperative.        Thought Content: Thought content normal.    Results for orders placed or performed in visit on 04/11/23  Microalbumin, Urine Waived   Collection Time: 04/11/23 10:06 AM  Result Value Ref Range   Microalb, Ur Waived 30 (H) 0 - 19 mg/L   Creatinine, Urine Waived 300 10 - 300 mg/dL   Microalb/Creat Ratio <30 <30 mg/g  CBC with Differential/Platelet   Collection Time: 04/11/23 10:09 AM  Result Value Ref Range   WBC 7.3 3.4 - 10.8 x10E3/uL   RBC 5.43 4.14 - 5.80 x10E6/uL   Hemoglobin 15.5 13.0 - 17.7 g/dL   Hematocrit 56.4 33.2 - 51.0 %   MCV 89 79 - 97 fL   MCH 28.5 26.6 - 33.0 pg   MCHC 32.2 31.5 - 35.7 g/dL   RDW 95.1 88.4 - 16.6 %   Platelets 220 150 - 450 x10E3/uL   Neutrophils 71 Not Estab. %   Lymphs 22 Not Estab. %   Monocytes 7 Not Estab. %   Eos 0 Not  Estab. %   Basos 0 Not Estab. %   Neutrophils Absolute 5.1 1.4 - 7.0 x10E3/uL   Lymphocytes Absolute 1.6 0.7 - 3.1 x10E3/uL   Monocytes Absolute 0.5 0.1 - 0.9 x10E3/uL   EOS (ABSOLUTE) 0.0 0.0 - 0.4 x10E3/uL   Basophils Absolute 0.0 0.0 - 0.2  x10E3/uL   Immature Granulocytes 0 Not Estab. %   Immature Grans (Abs) 0.0 0.0 - 0.1 x10E3/uL  Comprehensive metabolic panel   Collection Time: 04/11/23 10:09 AM  Result Value Ref Range   Glucose 80 70 - 99 mg/dL   BUN 16 6 - 24 mg/dL   Creatinine, Ser 2.95 0.76 - 1.27 mg/dL   eGFR 74 >28 UX/LKG/4.01   BUN/Creatinine Ratio 14 9 - 20   Sodium 144 134 - 144 mmol/L   Potassium 4.6 3.5 - 5.2 mmol/L   Chloride 104 96 - 106 mmol/L   CO2 25 20 - 29 mmol/L   Calcium 9.9 8.7 - 10.2 mg/dL   Total Protein 7.0 6.0 - 8.5 g/dL   Albumin 4.6 3.8 - 4.9 g/dL   Globulin, Total 2.4 1.5 - 4.5 g/dL   Bilirubin Total 0.9 0.0 - 1.2 mg/dL   Alkaline Phosphatase 69 44 - 121 IU/L   AST 36 0 - 40 IU/L   ALT 37 0 - 44 IU/L  TSH   Collection Time: 04/11/23 10:09 AM  Result Value Ref Range   TSH 0.881 0.450 - 4.500 uIU/mL  Lipid Panel w/o Chol/HDL Ratio   Collection Time: 04/11/23 10:09 AM  Result Value Ref Range   Cholesterol, Total 231 (H) 100 - 199 mg/dL   Triglycerides 027 0 - 149 mg/dL   HDL 59 >25 mg/dL   VLDL Cholesterol Cal 24 5 - 40 mg/dL   LDL Chol Calc (NIH) 366 (H) 0 - 99 mg/dL  PSA   Collection Time: 04/11/23 10:09 AM  Result Value Ref Range   Prostate Specific Ag, Serum 0.6 0.0 - 4.0 ng/mL  HIV Antibody (routine testing w rflx)   Collection Time: 04/11/23 10:09 AM  Result Value Ref Range   HIV Screen 4th Generation wRfx Non Reactive Non Reactive  HgB A1c   Collection Time: 04/11/23 10:09 AM  Result Value Ref Range   Hgb A1c MFr Bld 6.0 (H) 4.8 - 5.6 %   Est. average glucose Bld gHb Est-mCnc 126 mg/dL  VITAMIN D 25 Hydroxy (Vit-D Deficiency, Fractures)   Collection Time: 04/11/23 10:09 AM  Result Value Ref Range   Vit D, 25-Hydroxy 35.9  30.0 - 100.0 ng/mL  Hepatitis C antibody   Collection Time: 04/11/23 10:09 AM  Result Value Ref Range   Hep C Virus Ab Non Reactive Non Reactive  Specimen status report   Collection Time: 04/11/23 10:09 AM  Result Value Ref Range   specimen status report Comment       Assessment & Plan:   Problem List Items Addressed This Visit       Cardiovascular and Mediastinum   Essential hypertension - Primary   Chronic, ongoing.  BP elevated today and over past months.  Continue Metoprolol and Benazepril at this time, but restart Amlodipine at 2.5 MG daily.  Discussed with him that in future we may be able to minimize again, but for now need to control BP.  Recommend he monitor BP at least a few mornings a week at home and document.  DASH diet at home.  Labs today: next visit.         Relevant Medications   amLODipine (NORVASC) 2.5 MG tablet   Other Visit Diagnoses       Upper respiratory tract infection, unspecified type       Suspect Covid.  Overall improving today.  Continue to monitor.        Follow up plan: Return for as  scheduled 10/14/23.

## 2023-09-16 NOTE — Assessment & Plan Note (Signed)
Chronic, ongoing.  BP elevated today and over past months.  Continue Metoprolol and Benazepril at this time, but restart Amlodipine at 2.5 MG daily.  Discussed with him that in future we may be able to minimize again, but for now need to control BP.  Recommend he monitor BP at least a few mornings a week at home and document.  DASH diet at home.  Labs today: next visit.

## 2023-09-24 ENCOUNTER — Encounter: Payer: Self-pay | Admitting: Nurse Practitioner

## 2023-10-11 NOTE — Patient Instructions (Signed)
 Be Involved in Caring For Your Health:  Taking Medications When medications are taken as directed, they can greatly improve your health. But if they are not taken as prescribed, they may not work. In some cases, not taking them correctly can be harmful. To help ensure your treatment remains effective and safe, understand your medications and how to take them. Bring your medications to each visit for review by your provider.  Your lab results, notes, and after visit summary will be available on My Chart. We strongly encourage you to use this feature. If lab results are abnormal the clinic will contact you with the appropriate steps. If the clinic does not contact you assume the results are satisfactory. You can always view your results on My Chart. If you have questions regarding your health or results, please contact the clinic during office hours. You can also ask questions on My Chart.  We at [facility] are grateful that you chose [place] to provide your care. We strive to provide evidence-based and compassionate care and are always looking for feedback. If you get a survey from the clinic please complete this so we can hear your opinions.  Heart-Healthy Eating Plan Many factors influence your heart health, including eating and exercise habits. Heart health is also called coronary health. Coronary risk increases with abnormal blood fat (lipid) levels. A heart-healthy eating plan includes limiting unhealthy fats, increasing healthy fats, limiting salt (sodium) intake, and making other diet and lifestyle changes. What is my plan? Your health care provider may recommend that: You limit your fat intake to _________% or less of your total calories each day. You limit your saturated fat intake to _________% or less of your total calories each day. You limit the amount of cholesterol in your diet to less than _________ mg per day. You limit the amount of sodium in your diet to less than _________  mg per day. What are tips for following this plan? Cooking Cook foods using methods other than frying. Baking, boiling, grilling, and broiling are all good options. Other ways to reduce fat include: Removing the skin from poultry. Removing all visible fats from meats. Steaming vegetables in water or broth. Meal planning  At meals, imagine dividing your plate into fourths: Fill one-half of your plate with vegetables and green salads. Fill one-fourth of your plate with whole grains. Fill one-fourth of your plate with lean protein foods. Eat 2-4 cups of vegetables per day. One cup of vegetables equals 1 cup (91 g) broccoli or cauliflower florets, 2 medium carrots, 1 large bell pepper, 1 large sweet potato, 1 large tomato, 1 medium white potato, 2 cups (150 g) raw leafy greens. Eat 1-2 cups of fruit per day. One cup of fruit equals 1 small apple, 1 large banana, 1 cup (237 g) mixed fruit, 1 large orange,  cup (82 g) dried fruit, 1 cup (240 mL) 100% fruit juice. Eat more foods that contain soluble fiber. Examples include apples, broccoli, carrots, beans, peas, and barley. Aim to get 25-30 g of fiber per day. Increase your consumption of legumes, nuts, and seeds to 4-5 servings per week. One serving of dried beans or legumes equals  cup (90 g) cooked, 1 serving of nuts is  oz (12 almonds, 24 pistachios, or 7 walnut halves), and 1 serving of seeds equals  oz (8 g). Fats Choose healthy fats more often. Choose monounsaturated and polyunsaturated fats, such as olive and canola oils, avocado oil, flaxseeds, walnuts, almonds, and seeds. Eat  more omega-3 fats. Choose salmon, mackerel, sardines, tuna, flaxseed oil, and ground flaxseeds. Aim to eat fish at least 2 times each week. Check food labels carefully to identify foods with trans fats or high amounts of saturated fat. Limit saturated fats. These are found in animal products, such as meats, butter, and cream. Plant sources of saturated fats  include palm oil, palm kernel oil, and coconut oil. Avoid foods with partially hydrogenated oils in them. These contain trans fats. Examples are stick margarine, some tub margarines, cookies, crackers, and other baked goods. Avoid fried foods. General information Eat more home-cooked food and less restaurant, buffet, and fast food. Limit or avoid alcohol. Limit foods that are high in added sugar and simple starches such as foods made using white refined flour (white breads, pastries, sweets). Lose weight if you are overweight. Losing just 5-10% of your body weight can help your overall health and prevent diseases such as diabetes and heart disease. Monitor your sodium intake, especially if you have high blood pressure. Talk with your health care provider about your sodium intake. Try to incorporate more vegetarian meals weekly. What foods should I eat? Fruits All fresh, canned (in natural juice), or frozen fruits. Vegetables Fresh or frozen vegetables (raw, steamed, roasted, or grilled). Green salads. Grains Most grains. Choose whole wheat and whole grains most of the time. Rice and pasta, including brown rice and pastas made with whole wheat. Meats and other proteins Lean, well-trimmed beef, veal, pork, and lamb. Chicken and [place] without skin. All fish and shellfish. Wild duck, rabbit, pheasant, and venison. Egg whites or low-cholesterol egg substitutes. Dried beans, peas, lentils, and tofu. Seeds and most nuts. Dairy Low-fat or nonfat cheeses, including ricotta and mozzarella. Skim or 1% milk (liquid, powdered, or evaporated). Buttermilk made with low-fat milk. Nonfat or low-fat yogurt. Fats and oils Non-hydrogenated (trans-free) margarines. Vegetable oils, including soybean, sesame, sunflower, olive, avocado, peanut, safflower, corn, canola, and cottonseed. Salad dressings or mayonnaise made with a vegetable oil. Beverages Water (mineral or sparkling). Coffee and tea. Unsweetened ice  tea. Diet beverages. Sweets and desserts Sherbet, gelatin, and fruit ice. Small amounts of dark chocolate. Limit all sweets and desserts. Seasonings and condiments All seasonings and condiments. The items listed above may not be a complete list of foods and beverages you can eat. Contact a dietitian for more options. What foods should I avoid? Fruits Canned fruit in heavy syrup. Fruit in cream or butter sauce. Fried fruit. Limit coconut. Vegetables Vegetables cooked in cheese, cream, or butter sauce. Fried vegetables. Grains Breads made with saturated or trans fats, oils, or whole milk. Croissants. Sweet rolls. Donuts. High-fat crackers, such as cheese crackers and chips. Meats and other proteins Fatty meats, such as hot dogs, ribs, sausage, bacon, rib-eye roast or steak. High-fat deli meats, such as salami and bologna. Caviar. Domestic duck and goose. Organ meats, such as liver. Dairy Cream, sour cream, cream cheese, and creamed cottage cheese. Whole-milk cheeses. Whole or 2% milk (liquid, evaporated, or condensed). Whole buttermilk. Cream sauce or high-fat cheese sauce. Whole-milk yogurt. Fats and oils Meat fat, or shortening. Cocoa butter, hydrogenated oils, palm oil, coconut oil, palm kernel oil. Solid fats and shortenings, including bacon fat, salt pork, lard, and butter. Nondairy cream substitutes. Salad dressings with cheese or sour cream. Beverages Regular sodas and any drinks with added sugar. Sweets and desserts Frosting. Pudding. Cookies. Cakes. Pies. Milk chocolate or white chocolate. Buttered syrups. Full-fat ice cream or ice cream drinks. The items listed above may  not be a complete list of foods and beverages to avoid. Contact a dietitian for more information. Summary Heart-healthy meal planning includes limiting unhealthy fats, increasing healthy fats, limiting salt (sodium) intake and making other diet and lifestyle changes. Lose weight if you are overweight. Losing just  5-10% of your body weight can help your overall health and prevent diseases such as diabetes and heart disease. Focus on eating a balance of foods, including fruits and vegetables, low-fat or nonfat dairy, lean protein, nuts and legumes, whole grains, and heart-healthy oils and fats. This information is not intended to replace advice given to you by your health care provider. Make sure you discuss any questions you have with your health care provider. Document Revised: 09/10/2021 Document Reviewed: 09/10/2021 Elsevier Patient Education  2024 [employer].

## 2023-10-14 ENCOUNTER — Ambulatory Visit (INDEPENDENT_AMBULATORY_CARE_PROVIDER_SITE_OTHER): Payer: 59 | Admitting: Nurse Practitioner

## 2023-10-14 ENCOUNTER — Encounter: Payer: Self-pay | Admitting: Nurse Practitioner

## 2023-10-14 VITALS — BP 142/86 | HR 59 | Temp 98.4°F | Ht 69.0 in | Wt 221.4 lb

## 2023-10-14 DIAGNOSIS — E782 Mixed hyperlipidemia: Secondary | ICD-10-CM | POA: Diagnosis not present

## 2023-10-14 DIAGNOSIS — I480 Paroxysmal atrial fibrillation: Secondary | ICD-10-CM

## 2023-10-14 DIAGNOSIS — R7303 Prediabetes: Secondary | ICD-10-CM | POA: Diagnosis not present

## 2023-10-14 DIAGNOSIS — I10 Essential (primary) hypertension: Secondary | ICD-10-CM | POA: Diagnosis not present

## 2023-10-14 DIAGNOSIS — Z6831 Body mass index (BMI) 31.0-31.9, adult: Secondary | ICD-10-CM

## 2023-10-14 LAB — MICROALBUMIN, URINE WAIVED
Creatinine, Urine Waived: 300 mg/dL (ref 10–300)
Microalb, Ur Waived: 30 mg/L — ABNORMAL HIGH (ref 0–19)
Microalb/Creat Ratio: <30 mg/g (ref <30)

## 2023-10-14 MED ORDER — VALSARTAN 80 MG PO TABS: 80.0000 mg | ORAL_TABLET | Freq: Every day | ORAL | 3 refills | Status: DC

## 2023-10-14 NOTE — Assessment & Plan Note (Signed)
 Ongoing with A1c 6% last visit, recheck today.  Urine ALB 30 in past, continue ACE or ARB for kidney protection.  Continue diet focus at this time.

## 2023-10-14 NOTE — Assessment & Plan Note (Signed)
BMI 32.70.  Recommended eating smaller high protein, low fat meals more frequently and exercising 30 mins a day 5 times a week with a goal of 10-15lb weight loss in the next 3 months. Patient voiced their understanding and motivation to adhere to these recommendations. ? ?

## 2023-10-14 NOTE — Progress Notes (Signed)
 BP (!) 142/86 (BP Location: Left Arm, Patient Position: Sitting, Cuff Size: Normal)   Pulse (!) 59   Temp 98.4 F (36.9 C) (Oral)   Ht 5\' 9"  (1.753 m)   Wt 221 lb 6.4 oz (100.4 kg)   SpO2 99%   BMI 32.70 kg/m    Subjective:    Patient ID: Harold [name], male    DOB: 27-Sep-1965, 58 y.o.   MRN: [id number]  HPI: Harold [name] is a 58 y.o. male  Chief Complaint  Patient presents with   Hyperlipidemia   Hypertension   HYPERTENSION / HYPERLIPIDEMIA Continues on Amlodipine 2.5 MG, Benazepril 40 MG, and Metoprolol XL 25 daily.  Taking Amlodipine every other day because every day caused him to get too swimmy headed when standing up.  History of a-fib, no issues for long while. Satisfied with current treatment? yes Duration of hypertension: chronic BP monitoring frequency: occasionally BP range: 123 to 154/72 to 95 -- trending down at home BP medication side effects: no Duration of hyperlipidemia: chronic Cholesterol medication side effects: no Cholesterol supplements: none Medication compliance: good compliance Aspirin: no Recent stressors: no Recurrent headaches: no Visual changes: no Palpitations: no Dyspnea: no Chest pain: no Lower extremity edema: no Dizzy/lightheaded: when took Amlodipine every day  Impaired Fasting Glucose HbA1C:  Lab Results  Component Value Date   HGBA1C 6.0 (H) 04/11/2023  Duration of elevated blood sugar: years Polydipsia: no Polyuria: no Weight change: no Visual disturbance: no Glucose Monitoring: no    Accucheck frequency: Not Checking    Fasting glucose:     Post prandial:  Diabetic Education: Not Completed Family history of diabetes: no   Relevant past medical, surgical, family and social history reviewed and updated as indicated. Interim medical history since our last visit reviewed. Allergies and medications reviewed and updated.  Review of Systems  Constitutional:  Negative for activity change, diaphoresis,  fatigue and fever.  Respiratory:  Negative for cough, chest tightness, shortness of breath and wheezing.   Cardiovascular:  Negative for chest pain, palpitations and leg swelling.  Gastrointestinal: Negative.   Neurological: Negative.   Psychiatric/Behavioral: Negative.      Per HPI unless specifically indicated above     Objective:    BP (!) 142/86 (BP Location: Left Arm, Patient Position: Sitting, Cuff Size: Normal)   Pulse (!) 59   Temp 98.4 F (36.9 C) (Oral)   Ht 5\' 9"  (1.753 m)   Wt 221 lb 6.4 oz (100.4 kg)   SpO2 99%   BMI 32.70 kg/m   Wt Readings from Last 3 Encounters:  10/14/23 221 lb 6.4 oz (100.4 kg)  09/16/23 222 lb 3.2 oz (100.8 kg)  04/11/23 217 lb 3.2 oz (98.5 kg)    Physical Exam Vitals and nursing note reviewed.  Constitutional:      General: He is awake. He is not in acute distress.    Appearance: He is well-developed and well-groomed. He is obese. He is not ill-appearing or toxic-appearing.  HENT:     Head: Normocephalic.     Right Ear: Hearing and external ear normal.     Left Ear: Hearing and external ear normal.  Eyes:     General: Lids are normal.     Extraocular Movements: Extraocular movements intact.     Conjunctiva/sclera: Conjunctivae normal.  Neck:     Thyroid: No thyromegaly.     Vascular: No carotid bruit.  Cardiovascular:     Rate and Rhythm: Normal rate and  regular rhythm.     Heart sounds: Normal heart sounds. No murmur heard.    No gallop.  Pulmonary:     Effort: No accessory muscle usage or respiratory distress.     Breath sounds: Normal breath sounds.  Abdominal:     General: Bowel sounds are normal. There is no distension.     Palpations: Abdomen is soft.     Tenderness: There is no abdominal tenderness.  Musculoskeletal:     Cervical back: Full passive range of motion without pain.     Right lower leg: No edema.     Left lower leg: No edema.  Lymphadenopathy:     Cervical: No cervical adenopathy.  Skin:    General:  Skin is warm.     Capillary Refill: Capillary refill takes less than 2 seconds.  Neurological:     Mental Status: He is alert and oriented to person, place, and time.     Deep Tendon Reflexes: Reflexes are normal and symmetric.     Reflex Scores:      Brachioradialis reflexes are 2+ on the right side and 2+ on the left side.      Patellar reflexes are 2+ on the right side and 2+ on the left side. Psychiatric:        Attention and Perception: Attention normal.        Mood and Affect: Mood normal.        Speech: Speech normal.        Behavior: Behavior normal. Behavior is cooperative.        Thought Content: Thought content normal.     Results for orders placed or performed in visit on 04/11/23  Microalbumin, Urine Waived   Collection Time: 04/11/23 10:06 AM  Result Value Ref Range   Microalb, Ur Waived 30 (H) 0 - 19 mg/L   Creatinine, Urine Waived 300 10 - 300 mg/dL   Microalb/Creat Ratio <30 <30 mg/g  CBC with Differential/Platelet   Collection Time: 04/11/23 10:09 AM  Result Value Ref Range   WBC 7.3 3.4 - 10.8 x10E3/uL   RBC 5.43 4.14 - 5.80 x10E6/uL   Hemoglobin 15.5 13.0 - 17.7 g/dL   Hematocrit 08.6 57.8 - 51.0 %   MCV 89 79 - 97 fL   MCH 28.5 26.6 - 33.0 pg   MCHC 32.2 31.5 - 35.7 g/dL   RDW 46.9 62.9 - 52.8 %   Platelets 220 150 - 450 x10E3/uL   Neutrophils 71 Not Estab. %   Lymphs 22 Not Estab. %   Monocytes 7 Not Estab. %   Eos 0 Not Estab. %   Basos 0 Not Estab. %   Neutrophils Absolute 5.1 1.4 - 7.0 x10E3/uL   Lymphocytes Absolute 1.6 0.7 - 3.1 x10E3/uL   Monocytes Absolute 0.5 0.1 - 0.9 x10E3/uL   EOS (ABSOLUTE) 0.0 0.0 - 0.4 x10E3/uL   Basophils Absolute 0.0 0.0 - 0.2 x10E3/uL   Immature Granulocytes 0 Not Estab. %   Immature Grans (Abs) 0.0 0.0 - 0.1 x10E3/uL  Comprehensive metabolic panel   Collection Time: 04/11/23 10:09 AM  Result Value Ref Range   Glucose 80 70 - 99 mg/dL   BUN 16 6 - 24 mg/dL   Creatinine, Ser 4.13 0.76 - 1.27 mg/dL   eGFR 74  >24 MW/NUU/7.25   BUN/Creatinine Ratio 14 9 - 20   Sodium 144 134 - 144 mmol/L   Potassium 4.6 3.5 - 5.2 mmol/L   Chloride 104 96 - 106 mmol/L  CO2 25 20 - 29 mmol/L   Calcium 9.9 8.7 - 10.2 mg/dL   Total Protein 7.0 6.0 - 8.5 g/dL   Albumin 4.6 3.8 - 4.9 g/dL   Globulin, Total 2.4 1.5 - 4.5 g/dL   Bilirubin Total 0.9 0.0 - 1.2 mg/dL   Alkaline Phosphatase 69 44 - 121 IU/L   AST 36 0 - 40 IU/L   ALT 37 0 - 44 IU/L  TSH   Collection Time: 04/11/23 10:09 AM  Result Value Ref Range   TSH 0.881 0.450 - 4.500 uIU/mL  Lipid Panel w/o Chol/HDL Ratio   Collection Time: 04/11/23 10:09 AM  Result Value Ref Range   Cholesterol, Total 231 (H) 100 - 199 mg/dL   Triglycerides 161 0 - 149 mg/dL   HDL 59 >09 mg/dL   VLDL Cholesterol Cal 24 5 - 40 mg/dL   LDL Chol Calc (NIH) 604 (H) 0 - 99 mg/dL  PSA   Collection Time: 04/11/23 10:09 AM  Result Value Ref Range   Prostate Specific Ag, Serum 0.6 0.0 - 4.0 ng/mL  HIV Antibody (routine testing w rflx)   Collection Time: 04/11/23 10:09 AM  Result Value Ref Range   HIV Screen 4th Generation wRfx Non Reactive Non Reactive  HgB A1c   Collection Time: 04/11/23 10:09 AM  Result Value Ref Range   Hgb A1c MFr Bld 6.0 (H) 4.8 - 5.6 %   Est. average glucose Bld gHb Est-mCnc 126 mg/dL  VITAMIN D 25 Hydroxy (Vit-D Deficiency, Fractures)   Collection Time: 04/11/23 10:09 AM  Result Value Ref Range   Vit D, 25-Hydroxy 35.9 30.0 - 100.0 ng/mL  Hepatitis C antibody   Collection Time: 04/11/23 10:09 AM  Result Value Ref Range   Hep C Virus Ab Non Reactive Non Reactive  Specimen status report   Collection Time: 04/11/23 10:09 AM  Result Value Ref Range   specimen status report Comment       Assessment & Plan:   Problem List Items Addressed This Visit       Cardiovascular and Mediastinum   Essential hypertension   Chronic, ongoing.  BP elevated today above goal and some elevations at home.  Can not take Amlodipine daily due to orthostatic  changes.  Do not want to add more medication.  Can not go up on Metoprolol due to low resting HR.  Will stop Benazepril and trial change to Valsartan 80 MG daily, educated him on this change -- may get more benefit from this and have more room to increase.  Continue current Metoprolol dosing and Amlodipine every other day.  Discussed with him that in future we may be able to minimize again, but for now need to control BP.  Recommend he monitor BP at least a few mornings a week at home and document.  DASH diet at home.  Labs today: CMP and urine ALB.       Relevant Medications   valsartan (DIOVAN) 80 MG tablet   Other Relevant Orders   Microalbumin, Urine Waived   Comprehensive metabolic panel   Paroxysmal atrial fibrillation (HCC) - Primary   Chronic, stable.  HR regular today.  He would like to come off Metoprolol due to his wife and his concern for weight gain.  Feels his HR has been regular for some time.  Discussed with him that Metoprolol is most likely keeping HR regular and would not recommend stopping.  He stopped his Xarelto months back.  Recommend he reach out to Dr.  Gollan for his recommendation on this as well.  Discussed at length risks with a-fib.      Relevant Medications   valsartan (DIOVAN) 80 MG tablet     Other   Adult BMI 31.0-31.9 kg/sq m   BMI 32.70.  Recommended eating smaller high protein, low fat meals more frequently and exercising 30 mins a day 5 times a week with a goal of 10-15lb weight loss in the next 3 months. Patient voiced their understanding and motivation to adhere to these recommendations.       Mixed hyperlipidemia   Chronic, ongoing.  No current medications, although would be beneficial.  Prefers to keep medications low.  Lipid panel today and continue to recommend statin therapy. The 10-year ASCVD risk score (Arnett DK, et al., 2019) is: 12.6%   Values used to calculate the score:     Age: 84 years     Sex: Male     Is Non-Hispanic African American:  Yes     Diabetic: No     Tobacco smoker: No     Systolic Blood Pressure: 139 mmHg     Is BP treated: Yes     HDL Cholesterol: 59 mg/dL     Total Cholesterol: 231 mg/dL       Relevant Medications   valsartan (DIOVAN) 80 MG tablet   Other Relevant Orders   Comprehensive metabolic panel   Lipid Panel w/o Chol/HDL Ratio   Pre-diabetes   Ongoing with A1c 6% last visit, recheck today.  Urine ALB 30 in past, continue ACE or ARB for kidney protection.  Continue diet focus at this time.      Relevant Orders   HgB A1c     Follow up plan: Return in about 5 weeks (around 11/18/2023) for HTN -- started Valsartan.

## 2023-10-14 NOTE — Assessment & Plan Note (Signed)
 Chronic, ongoing.  No current medications, although would be beneficial.  Prefers to keep medications low.  Lipid panel today and continue to recommend statin therapy. The 10-year ASCVD risk score (Arnett DK, et al., 2019) is: 12.6%   Values used to calculate the score:     Age: 58 years     Sex: Male     Is Non-Hispanic African American: Yes     Diabetic: No     Tobacco smoker: No     Systolic Blood Pressure: 139 mmHg     Is BP treated: Yes     HDL Cholesterol: 59 mg/dL     Total Cholesterol: 231 mg/dL

## 2023-10-14 NOTE — Assessment & Plan Note (Signed)
 Chronic, stable.  HR regular today.  He would like to come off Metoprolol due to his wife and his concern for weight gain.  Feels his HR has been regular for some time.  Discussed with him that Metoprolol is most likely keeping HR regular and would not recommend stopping.  He stopped his Xarelto months back.  Recommend he reach out to Dr. Mariah Milling for his recommendation on this as well.  Discussed at length risks with a-fib.

## 2023-10-14 NOTE — Assessment & Plan Note (Signed)
 Chronic, ongoing.  BP elevated today above goal and some elevations at home.  Can not take Amlodipine daily due to orthostatic changes.  Do not want to add more medication.  Can not go up on Metoprolol due to low resting HR.  Will stop Benazepril and trial change to Valsartan 80 MG daily, educated him on this change -- may get more benefit from this and have more room to increase.  Continue current Metoprolol dosing and Amlodipine every other day.  Discussed with him that in future we may be able to minimize again, but for now need to control BP.  Recommend he monitor BP at least a few mornings a week at home and document.  DASH diet at home.  Labs today: CMP and urine ALB.

## 2023-10-15 ENCOUNTER — Encounter: Payer: Self-pay | Admitting: Nurse Practitioner

## 2023-10-15 LAB — LIPID PANEL W/O CHOL/HDL RATIO
Cholesterol, Total: 220 mg/dL — ABNORMAL HIGH (ref 100–199)
HDL: 56 mg/dL (ref >39)
LDL Chol Calc (NIH): 140 mg/dL — ABNORMAL HIGH (ref 0–99)
Triglycerides: 136 mg/dL (ref 0–149)
VLDL Cholesterol Cal: 24 mg/dL (ref 5–40)

## 2023-10-15 LAB — HEMOGLOBIN A1C
Est. average glucose Bld gHb Est-mCnc: 126 mg/dL
Hgb A1c MFr Bld: 6 % — ABNORMAL HIGH (ref 4.8–5.6)

## 2023-10-15 LAB — COMPREHENSIVE METABOLIC PANEL
ALT: 30 IU/L (ref 0–44)
AST: 31 IU/L (ref 0–40)
Albumin: 4.7 g/dL (ref 3.8–4.9)
Alkaline Phosphatase: 65 IU/L (ref 44–121)
BUN/Creatinine Ratio: 12 (ref 9–20)
BUN: 13 mg/dL (ref 6–24)
Bilirubin Total: 1 mg/dL (ref 0.0–1.2)
CO2: 23 mmol/L (ref 20–29)
Calcium: 9.6 mg/dL (ref 8.7–10.2)
Chloride: 106 mmol/L (ref 96–106)
Creatinine, Ser: 1.12 mg/dL (ref 0.76–1.27)
Globulin, Total: 2.3 g/dL (ref 1.5–4.5)
Glucose: 84 mg/dL (ref 70–99)
Potassium: 4.4 mmol/L (ref 3.5–5.2)
Sodium: 146 mmol/L — ABNORMAL HIGH (ref 134–144)
Total Protein: 7 g/dL (ref 6.0–8.5)
eGFR: 78 mL/min/{1.73_m2} (ref >59)

## 2023-10-15 NOTE — Progress Notes (Signed)
 Contacted via MyChart The 10-year ASCVD risk score (Arnett DK, et al., 2019) is: 13.1%   Values used to calculate the score:     Age: 58 years     Sex: Male     Is Non-Hispanic African American: Yes     Diabetic: No     Tobacco smoker: No     Systolic Blood Pressure: 142 mmHg     Is BP treated: Yes     HDL Cholesterol: 56 mg/dL     Total Cholesterol: 220 mg/dL   Good evening Harold [name], your labs have returned: - A1c remains 6% in the prediabetes range.  I highly recommend looking at the American Diabetes Association website, they have a plethora of information on diet changes and recipes too. - Kidney function, creatinine and eGFR, remains normal, as is liver function, AST and ALT.  - Sodium, salt, level a little elevated.  Reduce salt a little in diet and drink more water. - Lipid panel continues to show elevated levels.  I do recommend statin therapy for stroke prevention and heart protection.  I know you prefer less medication, but I do recommend thinking about this. Any questions? Keep being stellar!!  Thank you for allowing me to participate in your care.  I appreciate you. Kindest regards, Shandon Burlingame

## 2023-11-16 NOTE — Patient Instructions (Signed)

## 2023-11-25 ENCOUNTER — Encounter: Payer: Self-pay | Admitting: Nurse Practitioner

## 2023-11-25 ENCOUNTER — Ambulatory Visit (INDEPENDENT_AMBULATORY_CARE_PROVIDER_SITE_OTHER): Payer: 59 | Admitting: Nurse Practitioner

## 2023-11-25 VITALS — BP 106/67 | HR 78 | Temp 98.7°F | Ht 69.0 in | Wt 223.4 lb

## 2023-11-25 DIAGNOSIS — I10 Essential (primary) hypertension: Secondary | ICD-10-CM

## 2023-11-25 MED ORDER — VALSARTAN 80 MG PO TABS: 80.0000 mg | ORAL_TABLET | Freq: Every day | ORAL | 1 refills | Status: DC

## 2023-11-25 NOTE — Progress Notes (Signed)
 BP 106/67   Pulse 78   Temp 98.7 F (37.1 C) (Oral)   Ht 5\' 9"  (1.753 m)   Wt 223 lb 6.4 oz (101.3 kg)   SpO2 98%   BMI 32.99 kg/m    Subjective:    Patient ID: Harold [name], male    DOB: 1966-06-20, 58 y.o.   MRN: [id number]  HPI: Harold [name] is a 58 y.o. male  Chief Complaint  Patient presents with   Hypertension   HYPERTENSION / HYPERLIPIDEMIA Takes Amlodipine 2.5 MG, Valsartan 80 MG, and Metoprolol XL 25 daily.  History of a-fib, no issues for long while.  Stopped Benazepril last visit and started Valsartan, is tolerating well without ADR.  BP a little too low at times and has held his Amlodipine on occasion. Satisfied with current treatment? yes Duration of hypertension: chronic BP monitoring frequency: every night BP range: average over past 30 days has been 130/80 BP medication side effects: no Duration of hyperlipidemia: chronic Cholesterol medication side effects: no Cholesterol supplements: none Medication compliance: good compliance Aspirin: no Recent stressors: no Recurrent headaches: no Visual changes: no Palpitations: no Dyspnea: no Chest pain: no Lower extremity edema: no Dizzy/lightheaded: when took Amlodipine every day  Relevant past medical, surgical, family and social history reviewed and updated as indicated. Interim medical history since our last visit reviewed. Allergies and medications reviewed and updated.  Review of Systems  Constitutional:  Negative for activity change, diaphoresis, fatigue and fever.  Respiratory:  Negative for cough, chest tightness, shortness of breath and wheezing.   Cardiovascular:  Negative for chest pain, palpitations and leg swelling.  Gastrointestinal: Negative.   Neurological: Negative.   Psychiatric/Behavioral: Negative.      Per HPI unless specifically indicated above     Objective:    BP 106/67   Pulse 78   Temp 98.7 F (37.1 C) (Oral)   Ht 5\' 9"  (1.753 m)   Wt 223 lb 6.4 oz  (101.3 kg)   SpO2 98%   BMI 32.99 kg/m   Wt Readings from Last 3 Encounters:  11/25/23 223 lb 6.4 oz (101.3 kg)  10/14/23 221 lb 6.4 oz (100.4 kg)  09/16/23 222 lb 3.2 oz (100.8 kg)    Physical Exam Vitals and nursing note reviewed.  Constitutional:      General: He is awake. He is not in acute distress.    Appearance: He is well-developed and well-groomed. He is obese. He is not ill-appearing or toxic-appearing.  HENT:     Head: Normocephalic.     Right Ear: Hearing and external ear normal.     Left Ear: Hearing and external ear normal.  Eyes:     General: Lids are normal.     Extraocular Movements: Extraocular movements intact.     Conjunctiva/sclera: Conjunctivae normal.  Neck:     Thyroid: No thyromegaly.     Vascular: No carotid bruit.  Cardiovascular:     Rate and Rhythm: Normal rate and regular rhythm.     Heart sounds: Normal heart sounds. No murmur heard.    No gallop.  Pulmonary:     Effort: No accessory muscle usage or respiratory distress.     Breath sounds: Normal breath sounds.  Abdominal:     General: Bowel sounds are normal. There is no distension.     Palpations: Abdomen is soft.     Tenderness: There is no abdominal tenderness.  Musculoskeletal:     Cervical back: Full passive range of  motion without pain.     Right lower leg: No edema.     Left lower leg: No edema.  Lymphadenopathy:     Cervical: No cervical adenopathy.  Skin:    General: Skin is warm.     Capillary Refill: Capillary refill takes less than 2 seconds.  Neurological:     Mental Status: He is alert and oriented to person, place, and time.     Deep Tendon Reflexes: Reflexes are normal and symmetric.     Reflex Scores:      Brachioradialis reflexes are 2+ on the right side and 2+ on the left side.      Patellar reflexes are 2+ on the right side and 2+ on the left side. Psychiatric:        Attention and Perception: Attention normal.        Mood and Affect: Mood normal.        Speech:  Speech normal.        Behavior: Behavior normal. Behavior is cooperative.        Thought Content: Thought content normal.    Results for orders placed or performed in visit on 10/14/23  Microalbumin, Urine Waived   Collection Time: 10/14/23 11:20 AM  Result Value Ref Range   Microalb, Ur Waived 30 (H) 0 - 19 mg/L   Creatinine, Urine Waived 300 10 - 300 mg/dL   Microalb/Creat Ratio <30 <30 mg/g  HgB A1c   Collection Time: 10/14/23 11:21 AM  Result Value Ref Range   Hgb A1c MFr Bld 6.0 (H) 4.8 - 5.6 %   Est. average glucose Bld gHb Est-mCnc 126 mg/dL  Comprehensive metabolic panel   Collection Time: 10/14/23 11:21 AM  Result Value Ref Range   Glucose 84 70 - 99 mg/dL   BUN 13 6 - 24 mg/dL   Creatinine, Ser 1.61 0.76 - 1.27 mg/dL   eGFR 78 >09 UE/AVW/0.98   BUN/Creatinine Ratio 12 9 - 20   Sodium 146 (H) 134 - 144 mmol/L   Potassium 4.4 3.5 - 5.2 mmol/L   Chloride 106 96 - 106 mmol/L   CO2 23 20 - 29 mmol/L   Calcium 9.6 8.7 - 10.2 mg/dL   Total Protein 7.0 6.0 - 8.5 g/dL   Albumin 4.7 3.8 - 4.9 g/dL   Globulin, Total 2.3 1.5 - 4.5 g/dL   Bilirubin Total 1.0 0.0 - 1.2 mg/dL   Alkaline Phosphatase 65 44 - 121 IU/L   AST 31 0 - 40 IU/L   ALT 30 0 - 44 IU/L  Lipid Panel w/o Chol/HDL Ratio   Collection Time: 10/14/23 11:21 AM  Result Value Ref Range   Cholesterol, Total 220 (H) 100 - 199 mg/dL   Triglycerides 119 0 - 149 mg/dL   HDL 56 >14 mg/dL   VLDL Cholesterol Cal 24 5 - 40 mg/dL   LDL Chol Calc (NIH) 782 (H) 0 - 99 mg/dL      Assessment & Plan:   Problem List Items Addressed This Visit       Cardiovascular and Mediastinum   Essential hypertension - Primary   Chronic, BP improved with Valsartan.  Recommend he try holding Amlodipine since BP now a bit on lower side, we can always increase Valsartan if needed.  Continue Valsartan and Metoprolol. Discussed with him that in future we may be able to minimize again, but for now need to control BP.  Recommend he monitor BP  at least a few mornings a week at  home and document. DASH diet at home. Labs today: up to date.       Relevant Medications   valsartan (DIOVAN) 80 MG tablet   Other Relevant Orders   Basic metabolic panel with GFR     Follow up plan: Return in about 5 months (around 04/12/2024) for Annual Physical -- after 04/10/24.

## 2023-11-25 NOTE — Assessment & Plan Note (Addendum)
 Chronic, BP improved with Valsartan.  Recommend he try holding Amlodipine since BP now a bit on lower side, we can always increase Valsartan if needed.  Continue Valsartan and Metoprolol. Discussed with him that in future we may be able to minimize again, but for now need to control BP.  Recommend he monitor BP at least a few mornings a week at home and document. DASH diet at home. Labs today: up to date.

## 2024-02-23 ENCOUNTER — Other Ambulatory Visit: Payer: Self-pay | Admitting: Nurse Practitioner

## 2024-02-25 NOTE — Telephone Encounter (Signed)
 Requested Prescriptions  Pending Prescriptions Disp Refills   amLODipine  (NORVASC ) 2.5 MG tablet [Pharmacy Med Name: amLODIPine  Besylate 2.5 MG Oral Tablet] 90 tablet 0    Sig: TAKE 1 TABLET BY MOUTH DAILY     Cardiovascular: Calcium Channel Blockers 2 Passed - 02/25/2024  8:09 AM      Passed - Last BP in normal range    BP Readings from Last 1 Encounters:  11/25/23 106/67         Passed - Last Heart Rate in normal range    Pulse Readings from Last 1 Encounters:  11/25/23 78         Passed - Valid encounter within last 6 months    Recent Outpatient Visits           3 months ago Essential hypertension   [facility] [place], Teresita T, NP   4 months ago Paroxysmal atrial fibrillation [facility])   [place] [facility] [place], Melanie DASEN, NP

## 2024-04-25 NOTE — Patient Instructions (Signed)
 Be Involved in Caring For Your Health:  Taking Medications When medications are taken as directed, they can greatly improve your health. But if they are not taken as prescribed, they may not work. In some cases, not taking them correctly can be harmful. To help ensure your treatment remains effective and safe, understand your medications and how to take them. Bring your medications to each visit for review by your provider.  Your lab results, notes, and after visit summary will be available on My Chart. We strongly encourage you to use this feature. If lab results are abnormal the clinic will contact you with the appropriate steps. If the clinic does not contact you assume the results are satisfactory. You can always view your results on My Chart. If you have questions regarding your health or results, please contact the clinic during office hours. You can also ask questions on My Chart.  We at [facility] are grateful that you chose [place] to provide your care. We strive to provide evidence-based and compassionate care and are always looking for feedback. If you get a survey from the clinic please complete this so we can hear your opinions.  Heart-Healthy Eating Plan Many factors influence your heart health, including eating and exercise habits. Heart health is also called coronary health. Coronary risk increases with abnormal blood fat (lipid) levels. A heart-healthy eating plan includes limiting unhealthy fats, increasing healthy fats, limiting salt (sodium) intake, and making other diet and lifestyle changes. What is my plan? Your health care provider may recommend that: You limit your fat intake to _________% or less of your total calories each day. You limit your saturated fat intake to _________% or less of your total calories each day. You limit the amount of cholesterol in your diet to less than _________ mg per day. You limit the amount of sodium in your diet to less than _________  mg per day. What are tips for following this plan? Cooking Cook foods using methods other than frying. Baking, boiling, grilling, and broiling are all good options. Other ways to reduce fat include: Removing the skin from poultry. Removing all visible fats from meats. Steaming vegetables in water or broth. Meal planning  At meals, imagine dividing your plate into fourths: Fill one-half of your plate with vegetables and green salads. Fill one-fourth of your plate with whole grains. Fill one-fourth of your plate with lean protein foods. Eat 2-4 cups of vegetables per day. One cup of vegetables equals 1 cup (91 g) broccoli or cauliflower florets, 2 medium carrots, 1 large bell pepper, 1 large sweet potato, 1 large tomato, 1 medium white potato, 2 cups (150 g) raw leafy greens. Eat 1-2 cups of fruit per day. One cup of fruit equals 1 small apple, 1 large banana, 1 cup (237 g) mixed fruit, 1 large orange,  cup (82 g) dried fruit, 1 cup (240 mL) 100% fruit juice. Eat more foods that contain soluble fiber. Examples include apples, broccoli, carrots, beans, peas, and barley. Aim to get 25-30 g of fiber per day. Increase your consumption of legumes, nuts, and seeds to 4-5 servings per week. One serving of dried beans or legumes equals  cup (90 g) cooked, 1 serving of nuts is  oz (12 almonds, 24 pistachios, or 7 walnut halves), and 1 serving of seeds equals  oz (8 g). Fats Choose healthy fats more often. Choose monounsaturated and polyunsaturated fats, such as olive and canola oils, avocado oil, flaxseeds, walnuts, almonds, and seeds. Eat  more omega-3 fats. Choose salmon, mackerel, sardines, tuna, flaxseed oil, and ground flaxseeds. Aim to eat fish at least 2 times each week. Check food labels carefully to identify foods with trans fats or high amounts of saturated fat. Limit saturated fats. These are found in animal products, such as meats, butter, and cream. Plant sources of saturated fats  include palm oil, palm kernel oil, and coconut oil. Avoid foods with partially hydrogenated oils in them. These contain trans fats. Examples are stick margarine, some tub margarines, cookies, crackers, and other baked goods. Avoid fried foods. General information Eat more home-cooked food and less restaurant, buffet, and fast food. Limit or avoid alcohol. Limit foods that are high in added sugar and simple starches such as foods made using white refined flour (white breads, pastries, sweets). Lose weight if you are overweight. Losing just 5-10% of your body weight can help your overall health and prevent diseases such as diabetes and heart disease. Monitor your sodium intake, especially if you have high blood pressure. Talk with your health care provider about your sodium intake. Try to incorporate more vegetarian meals weekly. What foods should I eat? Fruits All fresh, canned (in natural juice), or frozen fruits. Vegetables Fresh or frozen vegetables (raw, steamed, roasted, or grilled). Green salads. Grains Most grains. Choose whole wheat and whole grains most of the time. Rice and pasta, including brown rice and pastas made with whole wheat. Meats and other proteins Lean, well-trimmed beef, veal, pork, and lamb. Chicken and [place] without skin. All fish and shellfish. Wild duck, rabbit, pheasant, and venison. Egg whites or low-cholesterol egg substitutes. Dried beans, peas, lentils, and tofu. Seeds and most nuts. Dairy Low-fat or nonfat cheeses, including ricotta and mozzarella. Skim or 1% milk (liquid, powdered, or evaporated). Buttermilk made with low-fat milk. Nonfat or low-fat yogurt. Fats and oils Non-hydrogenated (trans-free) margarines. Vegetable oils, including soybean, sesame, sunflower, olive, avocado, peanut, safflower, corn, canola, and cottonseed. Salad dressings or mayonnaise made with a vegetable oil. Beverages Water (mineral or sparkling). Coffee and tea. Unsweetened ice  tea. Diet beverages. Sweets and desserts Sherbet, gelatin, and fruit ice. Small amounts of dark chocolate. Limit all sweets and desserts. Seasonings and condiments All seasonings and condiments. The items listed above may not be a complete list of foods and beverages you can eat. Contact a dietitian for more options. What foods should I avoid? Fruits Canned fruit in heavy syrup. Fruit in cream or butter sauce. Fried fruit. Limit coconut. Vegetables Vegetables cooked in cheese, cream, or butter sauce. Fried vegetables. Grains Breads made with saturated or trans fats, oils, or whole milk. Croissants. Sweet rolls. Donuts. High-fat crackers, such as cheese crackers and chips. Meats and other proteins Fatty meats, such as hot dogs, ribs, sausage, bacon, rib-eye roast or steak. High-fat deli meats, such as salami and bologna. Caviar. Domestic duck and goose. Organ meats, such as liver. Dairy Cream, sour cream, cream cheese, and creamed cottage cheese. Whole-milk cheeses. Whole or 2% milk (liquid, evaporated, or condensed). Whole buttermilk. Cream sauce or high-fat cheese sauce. Whole-milk yogurt. Fats and oils Meat fat, or shortening. Cocoa butter, hydrogenated oils, palm oil, coconut oil, palm kernel oil. Solid fats and shortenings, including bacon fat, salt pork, lard, and butter. Nondairy cream substitutes. Salad dressings with cheese or sour cream. Beverages Regular sodas and any drinks with added sugar. Sweets and desserts Frosting. Pudding. Cookies. Cakes. Pies. Milk chocolate or white chocolate. Buttered syrups. Full-fat ice cream or ice cream drinks. The items listed above may  not be a complete list of foods and beverages to avoid. Contact a dietitian for more information. Summary Heart-healthy meal planning includes limiting unhealthy fats, increasing healthy fats, limiting salt (sodium) intake and making other diet and lifestyle changes. Lose weight if you are overweight. Losing just  5-10% of your body weight can help your overall health and prevent diseases such as diabetes and heart disease. Focus on eating a balance of foods, including fruits and vegetables, low-fat or nonfat dairy, lean protein, nuts and legumes, whole grains, and heart-healthy oils and fats. This information is not intended to replace advice given to you by your health care provider. Make sure you discuss any questions you have with your health care provider. Document Revised: 09/10/2021 Document Reviewed: 09/10/2021 Elsevier Patient Education  2024 [employer].

## 2024-04-27 ENCOUNTER — Ambulatory Visit: Attending: Nurse Practitioner

## 2024-04-27 ENCOUNTER — Ambulatory Visit (INDEPENDENT_AMBULATORY_CARE_PROVIDER_SITE_OTHER): Admitting: Nurse Practitioner

## 2024-04-27 ENCOUNTER — Encounter: Payer: Self-pay | Admitting: Nurse Practitioner

## 2024-04-27 VITALS — BP 116/77 | HR 64 | Temp 98.2°F | Resp 15 | Ht 69.02 in | Wt 222.4 lb

## 2024-04-27 DIAGNOSIS — I10 Essential (primary) hypertension: Secondary | ICD-10-CM

## 2024-04-27 DIAGNOSIS — R079 Chest pain, unspecified: Secondary | ICD-10-CM | POA: Diagnosis not present

## 2024-04-27 DIAGNOSIS — E559 Vitamin D deficiency, unspecified: Secondary | ICD-10-CM

## 2024-04-27 DIAGNOSIS — Z0000 Encounter for general adult medical examination without abnormal findings: Secondary | ICD-10-CM | POA: Diagnosis not present

## 2024-04-27 DIAGNOSIS — I480 Paroxysmal atrial fibrillation: Secondary | ICD-10-CM | POA: Diagnosis not present

## 2024-04-27 DIAGNOSIS — Z6831 Body mass index (BMI) 31.0-31.9, adult: Secondary | ICD-10-CM

## 2024-04-27 DIAGNOSIS — E782 Mixed hyperlipidemia: Secondary | ICD-10-CM

## 2024-04-27 DIAGNOSIS — R7303 Prediabetes: Secondary | ICD-10-CM

## 2024-04-27 DIAGNOSIS — Z23 Encounter for immunization: Secondary | ICD-10-CM

## 2024-04-27 DIAGNOSIS — N400 Enlarged prostate without lower urinary tract symptoms: Secondary | ICD-10-CM

## 2024-04-27 LAB — BAYER DCA HB A1C WAIVED: HB A1C (BAYER DCA - WAIVED): 5.9 % — ABNORMAL HIGH (ref 4.8–5.6)

## 2024-04-27 MED ORDER — METOPROLOL SUCCINATE ER 25 MG PO TB24
25.0000 mg | ORAL_TABLET | Freq: Every day | ORAL | 4 refills | Status: AC
Start: 2024-04-27 — End: ?

## 2024-04-27 MED ORDER — VALSARTAN 80 MG PO TABS: 80.0000 mg | ORAL_TABLET | Freq: Every day | ORAL | 4 refills | Status: AC

## 2024-04-27 MED ORDER — AMLODIPINE BESYLATE 2.5 MG PO TABS: 2.5000 mg | ORAL_TABLET | Freq: Every day | ORAL | 4 refills | Status: AC

## 2024-04-27 NOTE — Assessment & Plan Note (Signed)
 Ongoing with A1c trend down to 5.9% today.  Urine ALB 30 February 2025, continue ACE or ARB for kidney protection.  Continue diet and exercise focus at this time.

## 2024-04-27 NOTE — Progress Notes (Signed)
 BP 116/77 (BP Location: Left Arm, Patient Position: Sitting, Cuff Size: Large)   Pulse 64   Temp 98.2 F (36.8 C) (Oral)   Resp 15   Ht 5' 9.02 (1.753 m)   Wt 222 lb 6.4 oz (100.9 kg)   SpO2 98%   BMI 32.83 kg/m    Subjective:    Patient ID: Harold [name], male    DOB: 03-08-1966, 58 y.o.   MRN: [id number]  HPI: BENJIMAN SEDGWICK [name] is a 58 y.o. male presenting on 04/27/2024 for comprehensive medical examination. Current medical complaints include:none  He currently lives with: wife Interim Problems from his last visit: no   HYPERTENSION / HYPERLIPIDEMIA Taking Amlodipine , Valsartan , and Metoprolol . Has history of a-fib and took Xarelto  for a period.  Last saw cardiology 05/27/22. Continues on Vitamin D  at home for low levels. Satisfied with current treatment? yes Duration of hypertension: chronic BP monitoring frequency: once a week minimum BP range: on average <130/80 BP medication side effects: no Duration of hyperlipidemia: chronic Cholesterol supplements: none Aspirin : no Recent stressors: no Recurrent headaches: no Visual changes: no Palpitations: occasional -- no SOB, dizziness Dyspnea: no Chest pain: a couple weeks ago had a panic attack. Occasional pains with heart beat he has noticed. Felt a bit overwhelmed, has had some stressors. Thinks this was more panic.  Job stressors.  Plans to retire at end of year. Notices it when starts running too, so has been walking more often. Lower extremity edema: no Dizzy/lightheaded: no  The 10-year ASCVD risk score (Arnett DK, et al., 2019) is: 9.5%   Values used to calculate the score:     Age: 35 years     Clincally relevant sex: Male     Is Non-Hispanic African American: Yes     Diabetic: No     Tobacco smoker: No     Systolic Blood Pressure: 116 mmHg     Is BP treated: Yes     HDL Cholesterol: 56 mg/dL     Total Cholesterol: 220 mg/dL  ATRIAL FIBRILLATION Atrial fibrillation status: stable Satisfied with  current treatment: yes  Medication side effects:  no Medication compliance: good compliance Etiology of atrial fibrillation:  Palpitations:  as above Chest pain:  as above Dyspnea on exertion:  no Orthopnea:  no Syncope:  no Edema:  no Ventricular rate control: B-blocker Anti-coagulation: none   Impaired Fasting Glucose HbA1C:  Lab Results  Component Value Date   HGBA1C 5.9 (H) 04/27/2024  Duration of elevated blood sugar: chronic Polydipsia: no Polyuria: no Weight change: no Visual disturbance: no Glucose Monitoring: no    Accucheck frequency: Not Checking    Fasting glucose:     Post prandial:  Diabetic Education: Not Completed Family history of diabetes: no   Functional Status Survey: Is the patient deaf or have difficulty hearing?: No Does the patient have difficulty seeing, even when wearing glasses/contacts?: No Does the patient have difficulty concentrating, remembering, or making decisions?: No Does the patient have difficulty walking or climbing stairs?: No Does the patient have difficulty dressing or bathing?: No Does the patient have difficulty doing errands alone such as visiting a doctor's office or shopping?: No  FALL RISK:    04/27/2024    1:05 PM 11/25/2023    1:47 PM 10/14/2023   10:55 AM 04/11/2023   10:00 AM 06/11/2022   10:55 AM  Fall Risk   Falls in the past year? 0 0 0 0 0  Number falls in past  yr: 0 0 0 0 0  Injury with Fall? 0 0 0 0 0  Risk for fall due to : No Fall Risks No Fall Risks No Fall Risks No Fall Risks No Fall Risks  Follow up Falls evaluation completed Falls evaluation completed Falls evaluation completed Education provided Falls evaluation completed      Data saved with a previous flowsheet row definition   Depression Screen    04/27/2024    1:11 PM 11/25/2023    1:49 PM 10/14/2023   10:55 AM 04/11/2023    9:59 AM 06/11/2022   10:55 AM  Depression screen PHQ 2/9  Decreased Interest 1 1 0 1 0  Down, Depressed, Hopeless 1 1 0 1 0   PHQ - 2 Score 2 2 0 2 0  Altered sleeping 0 0 1 0 0  Tired, decreased energy 1 1 1  0 1  Change in appetite 1 0 1 0 0  Feeling bad or failure about yourself  1 0 0 1 0  Trouble concentrating 1 0 1 0 0  Moving slowly or fidgety/restless 0 0 0 0 0  Suicidal thoughts 0 0 0 0 0  PHQ-9 Score 6 3 4 3 1   Difficult doing work/chores Somewhat difficult Not difficult at all Not difficult at all Not difficult at all Not difficult at all      04/27/2024    1:12 PM 11/25/2023    1:49 PM 10/14/2023   10:56 AM 04/11/2023    9:59 AM  GAD 7 : Generalized Anxiety Score  Nervous, Anxious, on Edge 1 0 1 1  Control/stop worrying 1 0 1 0  Worry too much - different things 0 0 1 0  Trouble relaxing 0 1 0 0  Restless 0 0 0 0  Easily annoyed or irritable 0 0 0 0  Afraid - awful might happen 1 1 1  0  Total GAD 7 Score 3 2 4 1   Anxiety Difficulty Somewhat difficult Not difficult at all Not difficult at all Not difficult at all   Advanced Directives <no information>  Past Medical History:  Past Medical History:  Diagnosis Date   Hyperlipidemia    Hypertension     Surgical History:  Past Surgical History:  Procedure Laterality Date   wisdom teeth removed      Medications:  No current outpatient medications on file prior to visit.   No current facility-administered medications on file prior to visit.    Allergies:  Allergies  Allergen Reactions   Hct [Hydrochlorothiazide ] Other (See Comments)    ED    Social History:  Social History   Socioeconomic History   Marital status: Married    Spouse name: Not on file   Number of children: Not on file   Years of education: Not on file   Highest education level: Bachelor's degree (e.g., BA, AB, BS)  Occupational History   Not on file  Tobacco Use   Smoking status: Never   Smokeless tobacco: Never  Vaping Use   Vaping status: Never Used  Substance and Sexual Activity   Alcohol use: No   Drug use: No   Sexual activity: Not on file   Other Topics Concern   Not on file  Social History Narrative   Not on file   Social Drivers of Health   Financial Resource Strain: Low Risk  (04/27/2024)   Overall Financial Resource Strain (CARDIA)    Difficulty of Paying Living Expenses: Not hard at all  Food Insecurity:  No Food Insecurity (04/27/2024)   Hunger Vital Sign    Worried About Running Out of Food in the Last Year: Never true    Ran Out of Food in the Last Year: Never true  Transportation Needs: No Transportation Needs (04/27/2024)   PRAPARE - Administrator, Civil Service (Medical): No    Lack of Transportation (Non-Medical): No  Physical Activity: Sufficiently Active (04/27/2024)   Exercise Vital Sign    Days of Exercise per Week: 3 days    Minutes of Exercise per Session: 70 min  Stress: No Stress Concern Present (04/27/2024)   [employer] of Occupational Health - Occupational Stress Questionnaire    Feeling of Stress: Not at all  Social Connections: Moderately Isolated (04/27/2024)   Social Connection and Isolation Panel    Frequency of Communication with Friends and Family: Once a week    Frequency of Social Gatherings with Friends and Family: Once a week    Attends Religious Services: More than 4 times per year    Active Member of [employer] or Organizations: No    Attends Banker Meetings: Never    Marital Status: Married  Catering manager Violence: Not At Risk (04/27/2024)   Humiliation, Afraid, Rape, and Kick questionnaire    Fear of Current or Ex-Partner: No    Emotionally Abused: No    Physically Abused: No    Sexually Abused: No   Social History   Tobacco Use  Smoking Status Never  Smokeless Tobacco Never   Social History   Substance and Sexual Activity  Alcohol Use No    Family History:  Family History  Problem Relation Age of Onset   Parkinson's disease Mother    Hypertension Mother    Alcohol abuse Father    Hypertension Father     Past medical history, surgical  history, medications, allergies, family history and social history reviewed with patient today and changes made to appropriate areas of the chart.   Review of Systems - negative All other ROS negative except what is listed above and in the HPI.      Objective:    BP 116/77 (BP Location: Left Arm, Patient Position: Sitting, Cuff Size: Large)   Pulse 64   Temp 98.2 F (36.8 C) (Oral)   Resp 15   Ht 5' 9.02 (1.753 m)   Wt 222 lb 6.4 oz (100.9 kg)   SpO2 98%   BMI 32.83 kg/m   Wt Readings from Last 3 Encounters:  04/27/24 222 lb 6.4 oz (100.9 kg)  11/25/23 223 lb 6.4 oz (101.3 kg)  10/14/23 221 lb 6.4 oz (100.4 kg)    Physical Exam Vitals and nursing note reviewed.  Constitutional:      General: He is awake. He is not in acute distress.    Appearance: He is well-developed and well-groomed. He is obese. He is not ill-appearing.  HENT:     Head: Normocephalic and atraumatic.     Right Ear: Hearing, tympanic membrane, ear canal and external ear normal. No drainage.     Left Ear: Hearing, tympanic membrane, ear canal and external ear normal. No drainage.     Nose: Nose normal.     Mouth/Throat:     Pharynx: Uvula midline.  Eyes:     General: Lids are normal.        Right eye: No discharge.        Left eye: No discharge.     Extraocular Movements: Extraocular movements intact.  Conjunctiva/sclera: Conjunctivae normal.     Pupils: Pupils are equal, round, and reactive to light.     Visual Fields: Right eye visual fields normal and left eye visual fields normal.  Neck:     Thyroid : No thyromegaly.     Vascular: No carotid bruit or JVD.     Trachea: Trachea normal.  Cardiovascular:     Rate and Rhythm: Normal rate and regular rhythm.     Heart sounds: Normal heart sounds, S1 normal and S2 normal. No murmur heard.    No gallop.  Pulmonary:     Effort: Pulmonary effort is normal. No accessory muscle usage or respiratory distress.     Breath sounds: Normal breath sounds.   Abdominal:     General: Bowel sounds are normal.     Palpations: Abdomen is soft. There is no hepatomegaly or splenomegaly.     Tenderness: There is no abdominal tenderness.  Musculoskeletal:        General: Normal range of motion.     Cervical back: Normal range of motion and neck supple.     Right lower leg: No edema.     Left lower leg: No edema.  Lymphadenopathy:     Head:     Right side of head: No submental, submandibular, tonsillar, preauricular or posterior auricular adenopathy.     Left side of head: No submental, submandibular, tonsillar, preauricular or posterior auricular adenopathy.     Cervical: No cervical adenopathy.  Skin:    General: Skin is warm and dry.     Capillary Refill: Capillary refill takes less than 2 seconds.     Findings: No rash.  Neurological:     Mental Status: He is alert and oriented to person, place, and time.     Gait: Gait is intact.     Deep Tendon Reflexes: Reflexes are normal and symmetric.     Reflex Scores:      Brachioradialis reflexes are 2+ on the right side and 2+ on the left side.      Patellar reflexes are 2+ on the right side and 2+ on the left side. Psychiatric:        Attention and Perception: Attention normal.        Mood and Affect: Mood normal.        Speech: Speech normal.        Behavior: Behavior normal. Behavior is cooperative.        Thought Content: Thought content normal.        Cognition and Memory: Cognition normal.        Judgment: Judgment normal.   EKG My review and personal interpretation at Time: 1345    Indication: palpitations and chest pain  Rate: 58  Rhythm: sinus brady Axis: normal Other: mild elevation in T waves V2 and V5  Results for orders placed or performed in visit on 04/27/24  Bayer DCA Hb A1c Waived   Collection Time: 04/27/24  1:50 PM  Result Value Ref Range   HB A1C (BAYER DCA - WAIVED) 5.9 (H) 4.8 - 5.6 %      Assessment & Plan:   Problem List Items Addressed This Visit        Cardiovascular and Mediastinum   Paroxysmal atrial fibrillation (HCC) - Primary   Chronic, stable.  Previously saw cardiology, will get him back in to see them ASAP due to recent CP episodes and irregular beats.  On review at baseline is 50-60 in office, but recently with his  CP he has had episodes of HR in 100 range via his watch.  ?some atrial fib presenting.  Continue Metoprolol  daily and recommend he start taking Baby ASA daily until he sees cardiology.  EKG in office today no a-fib noted.  Will order Zio, educated him on this.      Relevant Medications   amLODipine  (NORVASC ) 2.5 MG tablet   metoprolol  succinate (TOPROL -XL) 25 MG 24 hr tablet   valsartan  (DIOVAN ) 80 MG tablet   Other Relevant Orders   Ambulatory referral to Cardiology   LONG TERM MONITOR XT (3-14 DAYS)   Essential hypertension   Chronic, BP at goal today. Continue Amlodipine , Valsartan  and Metoprolol . Discussed with him that in future we may be able to minimize again, but for now need to control BP.  Recommend he monitor BP at least a few mornings a week at home and document. DASH diet at home. Labs today: CBC, CMP, TSH.       Relevant Medications   amLODipine  (NORVASC ) 2.5 MG tablet   metoprolol  succinate (TOPROL -XL) 25 MG 24 hr tablet   valsartan  (DIOVAN ) 80 MG tablet   Other Relevant Orders   CBC with Differential/Platelet   Comprehensive metabolic panel with GFR   TSH     Other   Vitamin D  deficiency   Ongoing, recommend continue Vitamin D3 2000 units daily.  Recheck level today.        Relevant Orders   VITAMIN D  25 Hydroxy (Vit-D Deficiency, Fractures)   Pre-diabetes   Ongoing with A1c trend down to 5.9% today.  Urine ALB 30 February 2025, continue ACE or ARB for kidney protection.  Continue diet and exercise focus at this time.      Relevant Orders   Bayer DCA Hb A1c Waived (Completed)   Mixed hyperlipidemia   Chronic, ongoing.  No current medications, although would be beneficial.  He prefers not  to start. Lipid panel today and continue to recommend statin therapy. The 10-year ASCVD risk score (Arnett DK, et al., 2019) is: 9.5%   Values used to calculate the score:     Age: 38 years     Clincally relevant sex: Male     Is Non-Hispanic African American: Yes     Diabetic: No     Tobacco smoker: No     Systolic Blood Pressure: 116 mmHg     Is BP treated: Yes     HDL Cholesterol: 56 mg/dL     Total Cholesterol: 220 mg/dL       Relevant Medications   amLODipine  (NORVASC ) 2.5 MG tablet   metoprolol  succinate (TOPROL -XL) 25 MG 24 hr tablet   valsartan  (DIOVAN ) 80 MG tablet   Other Relevant Orders   Comprehensive metabolic panel with GFR   Lipid Panel w/o Chol/HDL Ratio   Chest pain   Intermittent over past few weeks with history of atrial fib.  ?If have episodes of this. EKG abnormal in office today, but no a-fib noted.  Previously saw cardiology, will get him back in to see them ASAP due to recent CP episodes and irregular beats. On review at baseline is 50-60 in office, but recently with his CP he has had episodes of HR in 100 range via his watch.  ?some atrial fib presenting.  Continue Metoprolol  daily and recommend he start taking Baby ASA daily until he sees cardiology.  Will order Zio, educated him on this.      Relevant Orders   Ambulatory referral to Cardiology   LONG  TERM MONITOR XT (3-14 DAYS)   Adult BMI 31.0-31.9 kg/sq m   BMI 32.83.  Recommended eating smaller high protein, low fat meals more frequently and exercising 30 mins a day 5 times a week with a goal of 10-15lb weight loss in the next 3 months. Patient voiced their understanding and motivation to adhere to these recommendations.       Other Visit Diagnoses       Benign prostatic hyperplasia without lower urinary tract symptoms       PSA on labs today.   Relevant Orders   PSA     Encounter for annual physical exam       Annual physical today with labs and health maintenance reviewed, discussed with  patient.       Discussed aspirin  prophylaxis for myocardial infarction prevention and decision was it was not indicated  LABORATORY TESTING:  Health maintenance labs ordered today as discussed above.   The natural history of prostate cancer and ongoing controversy regarding screening and potential treatment outcomes of prostate cancer has been discussed with the patient. The meaning of a false positive PSA and a false negative PSA has been discussed. He indicates understanding of the limitations of this screening test and wishes to proceed with screening PSA testing.  IMMUNIZATIONS:   - Tdap: Tetanus vaccination status reviewed: next visit he will obtain - Influenza: Not Up To Date -- refuses today, will return to office for these as prefers not ot get on same day as blood work - Pneumovax: Not applicable - Prevnar: Not Up To Date -- refuses today, will return to office for these as prefers not ot get on same day as blood work - Zostavax vaccine: Refused  SCREENING: - Colonoscopy: Up to date  Discussed with patient purpose of the colonoscopy is to detect colon cancer at curable precancerous or early stages   - AAA Screening: Not applicable  -Hearing Test: Not applicable  -Spirometry: Not applicable   PATIENT COUNSELING:    Sexuality: Discussed sexually transmitted diseases, partner selection, use of condoms, avoidance of unintended pregnancy  and contraceptive alternatives.   Advised to avoid cigarette smoking.  I discussed with the patient that most people either abstain from alcohol or drink within safe limits (<=14/week and <=4 drinks/occasion for males, <=7/weeks and <= 3 drinks/occasion for females) and that the risk for alcohol disorders and other health effects rises proportionally with the number of drinks per week and how often a drinker exceeds daily limits.  Discussed cessation/primary prevention of drug use and availability of treatment for abuse.   Diet: Encouraged to  adjust caloric intake to maintain  or achieve ideal body weight, to reduce intake of dietary saturated fat and total fat, to limit sodium intake by avoiding high sodium foods and not adding table salt, and to maintain adequate dietary potassium and calcium preferably from fresh fruits, vegetables, and low-fat dairy products.    stressed the importance of regular exercise  Injury prevention: Discussed safety belts, safety helmets, smoke detector, smoking near bedding or upholstery.   Dental health: Discussed importance of regular tooth brushing, flossing, and dental visits.   Follow up plan: NEXT PREVENTATIVE PHYSICAL DUE IN 1 YEAR. No follow-ups on file.

## 2024-04-27 NOTE — Assessment & Plan Note (Signed)
 BMI 32.83.  Recommended eating smaller high protein, low fat meals more frequently and exercising 30 mins a day 5 times a week with a goal of 10-15lb weight loss in the next 3 months. Patient voiced their understanding and motivation to adhere to these recommendations.

## 2024-04-27 NOTE — Assessment & Plan Note (Signed)
 Chronic, ongoing.  No current medications, although would be beneficial.  He prefers not to start. Lipid panel today and continue to recommend statin therapy. The 10-year ASCVD risk score (Arnett DK, et al., 2019) is: 9.5%   Values used to calculate the score:     Age: 58 years     Clincally relevant sex: Male     Is Non-Hispanic African American: Yes     Diabetic: No     Tobacco smoker: No     Systolic Blood Pressure: 116 mmHg     Is BP treated: Yes     HDL Cholesterol: 56 mg/dL     Total Cholesterol: 220 mg/dL

## 2024-04-27 NOTE — Assessment & Plan Note (Signed)
Ongoing, recommend continue Vitamin D3 2000 units daily.  Recheck level today.

## 2024-04-27 NOTE — Assessment & Plan Note (Signed)
 Intermittent over past few weeks with history of atrial fib.  ?If have episodes of this. EKG abnormal in office today, but no a-fib noted.  Previously saw cardiology, will get him back in to see them ASAP due to recent CP episodes and irregular beats. On review at baseline is 50-60 in office, but recently with his CP he has had episodes of HR in 100 range via his watch.  ?some atrial fib presenting.  Continue Metoprolol  daily and recommend he start taking Baby ASA daily until he sees cardiology.  Will order Zio, educated him on this.

## 2024-04-27 NOTE — Assessment & Plan Note (Signed)
 Chronic, BP at goal today. Continue Amlodipine , Valsartan  and Metoprolol . Discussed with him that in future we may be able to minimize again, but for now need to control BP.  Recommend he monitor BP at least a few mornings a week at home and document. DASH diet at home. Labs today: CBC, CMP, TSH.

## 2024-04-27 NOTE — Assessment & Plan Note (Signed)
 Chronic, stable.  Previously saw cardiology, will get him back in to see them ASAP due to recent CP episodes and irregular beats.  On review at baseline is 50-60 in office, but recently with his CP he has had episodes of HR in 100 range via his watch.  ?some atrial fib presenting.  Continue Metoprolol  daily and recommend he start taking Baby ASA daily until he sees cardiology.  EKG in office today no a-fib noted.  Will order Zio, educated him on this.

## 2024-04-28 ENCOUNTER — Ambulatory Visit: Payer: Self-pay | Admitting: Nurse Practitioner

## 2024-04-28 LAB — LIPID PANEL W/O CHOL/HDL RATIO
Cholesterol, Total: 212 mg/dL — ABNORMAL HIGH (ref 100–199)
HDL: 48 mg/dL (ref >39)
LDL Chol Calc (NIH): 132 mg/dL — ABNORMAL HIGH (ref 0–99)
Triglycerides: 182 mg/dL — ABNORMAL HIGH (ref 0–149)
VLDL Cholesterol Cal: 32 mg/dL (ref 5–40)

## 2024-04-28 LAB — CBC WITH DIFFERENTIAL/PLATELET
Basophils Absolute: 0 x10E3/uL (ref 0.0–0.2)
Basos: 1 %
EOS (ABSOLUTE): 0 x10E3/uL (ref 0.0–0.4)
Eos: 0 %
Hematocrit: 43.8 % (ref 37.5–51.0)
Hemoglobin: 14.6 g/dL (ref 13.0–17.7)
Immature Grans (Abs): 0 x10E3/uL (ref 0.0–0.1)
Immature Granulocytes: 0 %
Lymphocytes Absolute: 1.5 x10E3/uL (ref 0.7–3.1)
Lymphs: 25 %
MCH: 29.7 pg (ref 26.6–33.0)
MCHC: 33.3 g/dL (ref 31.5–35.7)
MCV: 89 fL (ref 79–97)
Monocytes Absolute: 0.5 x10E3/uL (ref 0.1–0.9)
Monocytes: 8 %
Neutrophils Absolute: 3.9 x10E3/uL (ref 1.4–7.0)
Neutrophils: 66 %
Platelets: 219 x10E3/uL (ref 150–450)
RBC: 4.91 x10E6/uL (ref 4.14–5.80)
RDW: 13.1 % (ref 11.6–15.4)
WBC: 5.9 x10E3/uL (ref 3.4–10.8)

## 2024-04-28 LAB — COMPREHENSIVE METABOLIC PANEL WITH GFR
ALT: 23 IU/L (ref 0–44)
AST: 20 IU/L (ref 0–40)
Albumin: 4.5 g/dL (ref 3.8–4.9)
Alkaline Phosphatase: 62 IU/L (ref 44–121)
BUN/Creatinine Ratio: 11 (ref 9–20)
BUN: 13 mg/dL (ref 6–24)
Bilirubin Total: 1 mg/dL (ref 0.0–1.2)
CO2: 22 mmol/L (ref 20–29)
Calcium: 9.5 mg/dL (ref 8.7–10.2)
Chloride: 104 mmol/L (ref 96–106)
Creatinine, Ser: 1.21 mg/dL (ref 0.76–1.27)
Globulin, Total: 2.3 g/dL (ref 1.5–4.5)
Glucose: 79 mg/dL (ref 70–99)
Potassium: 4.1 mmol/L (ref 3.5–5.2)
Sodium: 142 mmol/L (ref 134–144)
Total Protein: 6.8 g/dL (ref 6.0–8.5)
eGFR: 70 mL/min/1.73 (ref >59)

## 2024-04-28 LAB — PSA: Prostate Specific Ag, Serum: 0.8 ng/mL (ref 0.0–4.0)

## 2024-04-28 LAB — VITAMIN D 25 HYDROXY (VIT D DEFICIENCY, FRACTURES): Vit D, 25-Hydroxy: 25.8 ng/mL — ABNORMAL LOW (ref 30.0–100.0)

## 2024-04-28 LAB — TSH: TSH: 0.9 u[IU]/mL (ref 0.450–4.500)

## 2024-04-28 NOTE — Progress Notes (Signed)
 Contacted via MyChart The 10-year ASCVD risk score (Arnett DK, et al., 2019) is: 9.8%   Values used to calculate the score:     Age: 58 years     Clincally relevant sex: Male     Is Non-Hispanic African American: Yes     Diabetic: No     Tobacco smoker: No     Systolic Blood Pressure: 116 mmHg     Is BP treated: Yes     HDL Cholesterol: 48 mg/dL     Total Cholesterol: 212 mg/dL  Good evening Harold [name], your labs have returned: - Kidney function, creatinine and eGFR, remains normal, as is liver function, AST and ALT.  - Thyroid , prostate, and CBC labs are normal. - A1c remains in prediabetic range at 5.9%, continue focus on diet. - My major concern is that your lipid panel continues to show elevations and on calculation this places your risk for stroke or heart event in next 10 years at 9.8%.  At this level it is recommended to take statin therapy.  With your recent chest pain I would recommend starting a low dose of Rosuvastatin for protection.  Let me know your thoughts on this.  Any questions? Keep being stellar!!  Thank you for allowing me to participate in your care.  I appreciate you. Kindest regards, Filip Luten

## 2024-05-03 NOTE — Addendum Note (Signed)
 Addended by: Juanice Warburton M on: 05/03/2024 12:32 PM   Modules accepted: Orders

## 2024-05-06 NOTE — Progress Notes (Unsigned)
  Cardiology Office Note   Date:  05/07/2024  ID:  Harold [name], DOB 14-Dec-1965, MRN [id number] PCP: Valerio Melanie DASEN, NP  [facility] Providers Cardiologist:  Caron Poser, MD     History of Present Illness Harold [name] is a 58 y.o. male PMH HTN, persistent atrial fibrillation (not currently on anticoagulation) who presents for further evaluation and management of atrial fibrillation.  Patient reports a long history of palpitations which have worsened recently.  He notes that they seem to have improved since he put the monitor on.  He denies any syncopal episodes.  He says the episodes are typically lasting a couple of minutes at a time.  No clear inciting factors.  He denies any excessive caffeine, alcohol use or, recreational drug use.  Denies any history of sleep apnea or snoring last LDL 132 04/27/2024.  Relevant CVD History -Zio monitor pending -Initial diagnosis of atrial fibrillation 04/2022 -Coronary CTA 06/2021 CAD RADS 0   ROS: Pt denies any chest discomfort, jaw pain, arm pain, syncope, presyncope, orthopnea, PND, or LE edema.  Studies Reviewed I have independently reviewed the patient's ECG, previous cardiac testing, recent blood work, and prior medical records.  Physical Exam VS:  BP 120/80 (BP Location: Right Arm, Patient Position: Sitting, Cuff Size: Normal)   Pulse 67   Ht 5' 10 (1.778 m)   Wt 223 lb (101.2 kg)   SpO2 99%   BMI 32.00 kg/m        Wt Readings from Last 3 Encounters:  05/07/24 223 lb (101.2 kg)  04/27/24 222 lb 6.4 oz (100.9 kg)  11/25/23 223 lb 6.4 oz (101.3 kg)    GEN: No acute distress. NECK: No JVD; No carotid bruits. CARDIAC: RRR, no murmurs, rubs, gallops. RESPIRATORY:  Clear to auscultation. EXTREMITIES:  Warm and well-perfused. No edema.  ASSESSMENT AND PLAN Paroxysmal atrial fibrillation Paroxysmal tachycardia CHADS2VASC of 1. Not on AC.  Intermittent palpitations which last 1 to 2 minutes without  syncope or other high risk features.  No clear triggers.  Plan: - Follow-up monitor results - Can continue off of AC for now given low CHA2DS2-VASc score - If he is having symptomatic paroxysms of atrial fibrillation on his monitor, then we will have him see EP for consideration of PVI ablation and/or antiarrhythmic drug therapy - Echocardiogram to evaluate for structural causes - Continue metoprolol  25 mg XL for now.  If this seems to be ineffective, we can either trial a higher dose or diltiazem  as long as his EF is normal.  He would like to continue this dose for now.  HTN Well-controlled.  Continue valsartan  80 mg daily and Norvasc  2.5 mg daily  CV risk counseling HLD ASCVD risk score around 10%, which places him in the intermediate risk range.  Statin therapy is indicated.  Normal coronary CTA from 2022.  Recommend starting Crestor 5 mg daily.  He notes he would like to have his PCP prescribe this and discuss this with him since he will be seeing her long-term.        Dispo: RTC as needed based on results of cardiac testing  Signed, Caron Poser, MD

## 2024-05-07 ENCOUNTER — Ambulatory Visit

## 2024-05-07 VITALS — BP 120/80 | HR 67 | Ht 70.0 in | Wt 223.0 lb

## 2024-05-07 DIAGNOSIS — I479 Paroxysmal tachycardia, unspecified: Secondary | ICD-10-CM

## 2024-05-07 DIAGNOSIS — I10 Essential (primary) hypertension: Secondary | ICD-10-CM | POA: Diagnosis not present

## 2024-05-07 DIAGNOSIS — E782 Mixed hyperlipidemia: Secondary | ICD-10-CM

## 2024-05-07 DIAGNOSIS — Z7189 Other specified counseling: Secondary | ICD-10-CM

## 2024-05-07 DIAGNOSIS — R079 Chest pain, unspecified: Secondary | ICD-10-CM | POA: Diagnosis not present

## 2024-05-07 DIAGNOSIS — I480 Paroxysmal atrial fibrillation: Secondary | ICD-10-CM

## 2024-05-07 NOTE — Patient Instructions (Signed)
 Medication Instructions:  Your physician recommends that you continue on your current medications as directed. Please refer to the Current Medication list given to you today.  *If you need a refill on your cardiac medications before your next appointment, please call your pharmacy*  Lab Work: No labs ordered today  If you have labs (blood work) drawn today and your tests are completely normal, you will receive your results only by: MyChart Message (if you have MyChart) OR A paper copy in the mail If you have any lab test that is abnormal or we need to change your treatment, we will call you to review the results.  Testing/Procedures: Your physician has requested that you have an echocardiogram. Echocardiography is a painless test that uses sound waves to create images of your heart. It provides your doctor with information about the size and shape of your heart and how well your heart's chambers and valves are working.   You may receive an ultrasound enhancing agent through an IV if needed to better visualize your heart during the echo. This procedure takes approximately one hour.  There are no restrictions for this procedure.  This will take place at [address] [facility] [address], [place] [address]  Please note: We ask at that you not bring children with you during ultrasound (echo/ vascular) testing. Due to room size and safety concerns, children are not allowed in the ultrasound rooms during exams. Our front office staff cannot provide observation of children in our lobby area while testing is being conducted. An adult accompanying a patient to their appointment will only be allowed in the ultrasound room at the discretion of the ultrasound technician under special circumstances. We apologize for any inconvenience.   Follow-Up: At [facility], you and your health needs are our priority.  As part of our continuing mission to provide you with exceptional heart  care, our providers are all part of one team.  This team includes your primary Cardiologist (physician) and Advanced Practice Providers or APPs (Physician Assistants and Nurse Practitioners) who all work together to provide you with the care you need, when you need it.  Your next appointment:   As Needed   Provider:  Caron Poser, MD  We recommend signing up for the patient portal called MyChart.  Sign up information is provided on this After Visit Summary.  MyChart is used to connect with patients for Virtual Visits (Telemedicine).  Patients are able to view lab/test results, encounter notes, upcoming appointments, etc.  Non-urgent messages can be sent to your provider as well.   To learn more about what you can do with MyChart, go to [email or web address].

## 2024-05-20 DIAGNOSIS — R079 Chest pain, unspecified: Secondary | ICD-10-CM | POA: Diagnosis not present

## 2024-05-20 DIAGNOSIS — I480 Paroxysmal atrial fibrillation: Secondary | ICD-10-CM | POA: Diagnosis not present

## 2024-05-20 NOTE — Progress Notes (Signed)
 Contacted via MyChart  No atrial fibrillation seen on long term monitor, good news. You had a few extra beats, but these were rare.  Continue to visit with cardiology. I will alert them to these results.

## 2024-06-06 NOTE — Patient Instructions (Signed)
 Palpitations Palpitations are feelings that your heartbeat is not normal. Your heartbeat may feel like it is: Uneven (irregular). Faster than normal. Fluttering. Skipping a beat. This is usually not a serious problem. However, a doctor will do tests and check your medical history to make sure that you do not have a serious heart problem. Follow these instructions at home: Watch for any changes in your condition. Tell your doctor about any changes. Take these actions to help manage your symptoms: Eating and drinking Follow instructions from your doctor about things to eat and drink. You may be told to avoid these things: Drinks that have caffeine in them, such as coffee, tea, soft drinks, and energy drinks. Chocolate. Alcohol. Diet pills. Lifestyle     Try to lower your stress. These things can help you relax: Yoga. Deep breathing and meditation. Guided imagery. This is using words and images to create positive thoughts. Exercise, including swimming, jogging, and walking. Tell your doctor if you have more abnormal heartbeats when you are active. If you have chest pain or feel short of breath with exercise, do not keep doing the exercise until you are seen by your doctor. Biofeedback. This is using your mind to control things in your body, such as your heartbeat. Get plenty of rest and sleep. Keep a regular bed time. Do not use drugs, such as cocaine or ecstasy. Do not use marijuana. Do not smoke or use any products that contain nicotine or tobacco. If you need help quitting, ask your doctor. General instructions Take over-the-counter and prescription medicines only as told by your doctor. Keep all follow-up visits. You may need more tests if palpitations do not go away or get worse. Contact a doctor if: You keep having fast or uneven heartbeats for a long time. Your symptoms happen more often. Get help right away if: You have chest pain. You feel short of breath. You have a very  bad headache. You feel dizzy. You faint. These symptoms may be an emergency. Get help right away. Call your local emergency services (911 in the [place] Do not wait to see if the symptoms will go away. Do not drive yourself to the hospital. Summary Palpitations are feelings that your heartbeat is uneven or faster than normal. It may feel like your heart is fluttering or skipping a beat. Avoid food and drinks that may cause this condition. These include caffeine, chocolate, and alcohol. Try to lower your stress. Do not smoke or use drugs. Get help right away if you faint, feel dizzy, feel short of breath, have chest pain, or have a very bad headache. This information is not intended to replace advice given to you by your health care provider. Make sure you discuss any questions you have with your health care provider. Document Revised: 12/27/2020 Document Reviewed: 12/27/2020 Elsevier Patient Education  2024 [employer].

## 2024-06-08 ENCOUNTER — Ambulatory Visit (INDEPENDENT_AMBULATORY_CARE_PROVIDER_SITE_OTHER): Admitting: Nurse Practitioner

## 2024-06-08 VITALS — BP 118/80 | HR 60 | Temp 98.6°F | Resp 14 | Ht 70.0 in | Wt 222.8 lb

## 2024-06-08 DIAGNOSIS — I480 Paroxysmal atrial fibrillation: Secondary | ICD-10-CM | POA: Diagnosis not present

## 2024-06-08 DIAGNOSIS — E782 Mixed hyperlipidemia: Secondary | ICD-10-CM

## 2024-06-08 DIAGNOSIS — R079 Chest pain, unspecified: Secondary | ICD-10-CM

## 2024-06-08 NOTE — Assessment & Plan Note (Signed)
 Chronic, ongoing. He refuses to start statin therapy, lengthy discussion had with him on risks of not taking - such as stroke or MI. He reports understanding these risks. The 10-year ASCVD risk score (Arnett DK, et al., 2019) is: 10.2%   Values used to calculate the score:     Age: 58 years     Clincally relevant sex: Male     Is Non-Hispanic African American: Yes     Diabetic: No     Tobacco smoker: No     Systolic Blood Pressure: 118 mmHg     Is BP treated: Yes     HDL Cholesterol: 48 mg/dL     Total Cholesterol: 212 mg/dL

## 2024-06-08 NOTE — Assessment & Plan Note (Addendum)
 Chronic, stable. Continue to collaborate with cardiology, recent note reviewed. ?some atrial fib presenting.  Continue Metoprolol  daily and recommend he continue Baby ASA daily.  Zio overall reassuring recently. Is to have echo upcoming.  Strict ER precautions given. He refuses to start statin therapy, lengthy discussion had with him on risks of not taking - such as stroke or MI. He reports understanding these risks.

## 2024-06-08 NOTE — Progress Notes (Signed)
 BP 118/80 (BP Location: Left Arm, Patient Position: Sitting, Cuff Size: Large)   Pulse 60   Temp 98.6 F (37 C) (Oral)   Resp 14   Ht 5' 10 (1.778 m)   Wt 222 lb 12.8 oz (101.1 kg)   SpO2 97%   BMI 31.97 kg/m    Subjective:    Patient ID: Harold [name], male    DOB: 1965/12/24, 58 y.o.   MRN: [id number]  HPI: Harold [name] is a 58 y.o. male  Chief Complaint  Patient presents with   Follow-up    Blood pressure doing pretty good and chest pain comes and goes, more annoyance then anything.    PALPITATIONS AND CP Saw cardiology on 05/07/24.  They recommended he start Crestor 5 MG + ordered echo.  Overall no other changes made.  He continues to refuse statin therapy, does not like to take medication.  We discussed risks of not taking. Continues to have CP, lasting seconds - a minute tops. When goes for walk in morning there will be some in the first 15 minutes. Then randomly occurs otherwise. Does not happen every day.  Reports pain as a 4/10. Like someone is stabbing him in chest, sharp in nature. When CP happens does notice a little fluctuation in HR, palpitations. Denies any reflux issues.  When wore the Zio recently he had only 3 small episodes. Duration: months Symptom description: as above Duration of episode: as above Frequency: recurrent Activity when event occurred: as above Related to exertion: at times, feels like not getting enough air in - no history of asthma Dyspnea: as above Chest pain: yes Syncope: no Anxiety/stress: no, not lately Nausea/vomiting: no Diaphoresis: unsure Coronary artery disease: history of a-fib Congestive heart failure: no Arrhythmia: history of a-fib Thyroid  disease: no Caffeine intake: limits caffeine at baseline due to IBS Status:  stable Treatments attempted:ASA and BB   Relevant past medical, surgical, family and social history reviewed and updated as indicated. Interim medical history since our last visit  reviewed. Allergies and medications reviewed and updated.  Review of Systems  Constitutional:  Negative for activity change, diaphoresis, fatigue and fever.  Respiratory:  Negative for cough, chest tightness, shortness of breath and wheezing.   Cardiovascular:  Positive for chest pain and palpitations. Negative for leg swelling.  Gastrointestinal: Negative.   Neurological: Negative.   Psychiatric/Behavioral: Negative.      Per HPI unless specifically indicated above     Objective:    BP 118/80 (BP Location: Left Arm, Patient Position: Sitting, Cuff Size: Large)   Pulse 60   Temp 98.6 F (37 C) (Oral)   Resp 14   Ht 5' 10 (1.778 m)   Wt 222 lb 12.8 oz (101.1 kg)   SpO2 97%   BMI 31.97 kg/m   Wt Readings from Last 3 Encounters:  06/08/24 222 lb 12.8 oz (101.1 kg)  05/07/24 223 lb (101.2 kg)  04/27/24 222 lb 6.4 oz (100.9 kg)    Physical Exam Vitals and nursing note reviewed.  Constitutional:      General: He is awake. He is not in acute distress.    Appearance: He is well-developed and well-groomed. He is obese. He is not ill-appearing or toxic-appearing.  HENT:     Head: Normocephalic.     Right Ear: Hearing and external ear normal.     Left Ear: Hearing and external ear normal.  Eyes:     General: Lids are normal.  Extraocular Movements: Extraocular movements intact.     Conjunctiva/sclera: Conjunctivae normal.  Neck:     Thyroid : No thyromegaly.     Vascular: No carotid bruit.  Cardiovascular:     Rate and Rhythm: Normal rate and regular rhythm.     Heart sounds: Normal heart sounds. No murmur heard.    No gallop.  Pulmonary:     Effort: No accessory muscle usage or respiratory distress.     Breath sounds: Normal breath sounds.  Abdominal:     General: Bowel sounds are normal. There is no distension.     Palpations: Abdomen is soft.     Tenderness: There is no abdominal tenderness.  Musculoskeletal:     Cervical back: Full passive range of motion  without pain.     Right lower leg: No edema.     Left lower leg: No edema.  Lymphadenopathy:     Cervical: No cervical adenopathy.  Skin:    General: Skin is warm.     Capillary Refill: Capillary refill takes less than 2 seconds.  Neurological:     Mental Status: He is alert and oriented to person, place, and time.     Deep Tendon Reflexes: Reflexes are normal and symmetric.     Reflex Scores:      Brachioradialis reflexes are 2+ on the right side and 2+ on the left side.      Patellar reflexes are 2+ on the right side and 2+ on the left side. Psychiatric:        Attention and Perception: Attention normal.        Mood and Affect: Mood normal.        Speech: Speech normal.        Behavior: Behavior normal. Behavior is cooperative.        Thought Content: Thought content normal.     Results for orders placed or performed in visit on 04/27/24  Bayer DCA Hb A1c Waived   Collection Time: 04/27/24  1:50 PM  Result Value Ref Range   HB A1C (BAYER DCA - WAIVED) 5.9 (H) 4.8 - 5.6 %  CBC with Differential/Platelet   Collection Time: 04/27/24  1:51 PM  Result Value Ref Range   WBC 5.9 3.4 - 10.8 x10E3/uL   RBC 4.91 4.14 - 5.80 x10E6/uL   Hemoglobin 14.6 13.0 - 17.7 g/dL   Hematocrit 56.1 62.4 - 51.0 %   MCV 89 79 - 97 fL   MCH 29.7 26.6 - 33.0 pg   MCHC 33.3 31.5 - 35.7 g/dL   RDW 86.8 88.3 - 84.5 %   Platelets 219 150 - 450 x10E3/uL   Neutrophils 66 Not Estab. %   Lymphs 25 Not Estab. %   Monocytes 8 Not Estab. %   Eos 0 Not Estab. %   Basos 1 Not Estab. %   Neutrophils Absolute 3.9 1.4 - 7.0 x10E3/uL   Lymphocytes Absolute 1.5 0.7 - 3.1 x10E3/uL   Monocytes Absolute 0.5 0.1 - 0.9 x10E3/uL   EOS (ABSOLUTE) 0.0 0.0 - 0.4 x10E3/uL   Basophils Absolute 0.0 0.0 - 0.2 x10E3/uL   Immature Granulocytes 0 Not Estab. %   Immature Grans (Abs) 0.0 0.0 - 0.1 x10E3/uL  Comprehensive metabolic panel with GFR   Collection Time: 04/27/24  1:51 PM  Result Value Ref Range   Glucose 79 70 -  99 mg/dL   BUN 13 6 - 24 mg/dL   Creatinine, Ser 8.78 0.76 - 1.27 mg/dL   eGFR 70 >40  mL/min/1.73   BUN/Creatinine Ratio 11 9 - 20   Sodium 142 134 - 144 mmol/L   Potassium 4.1 3.5 - 5.2 mmol/L   Chloride 104 96 - 106 mmol/L   CO2 22 20 - 29 mmol/L   Calcium 9.5 8.7 - 10.2 mg/dL   Total Protein 6.8 6.0 - 8.5 g/dL   Albumin 4.5 3.8 - 4.9 g/dL   Globulin, Total 2.3 1.5 - 4.5 g/dL   Bilirubin Total 1.0 0.0 - 1.2 mg/dL   Alkaline Phosphatase 62 44 - 121 IU/L   AST 20 0 - 40 IU/L   ALT 23 0 - 44 IU/L  Lipid Panel w/o Chol/HDL Ratio   Collection Time: 04/27/24  1:51 PM  Result Value Ref Range   Cholesterol, Total 212 (H) 100 - 199 mg/dL   Triglycerides 817 (H) 0 - 149 mg/dL   HDL 48 >60 mg/dL   VLDL Cholesterol Cal 32 5 - 40 mg/dL   LDL Chol Calc (NIH) 867 (H) 0 - 99 mg/dL  TSH   Collection Time: 04/27/24  1:51 PM  Result Value Ref Range   TSH 0.900 0.450 - 4.500 uIU/mL  PSA   Collection Time: 04/27/24  1:51 PM  Result Value Ref Range   Prostate Specific Ag, Serum 0.8 0.0 - 4.0 ng/mL  VITAMIN D  25 Hydroxy (Vit-D Deficiency, Fractures)   Collection Time: 04/27/24  1:51 PM  Result Value Ref Range   Vit D, 25-Hydroxy 25.8 (L) 30.0 - 100.0 ng/mL      Assessment & Plan:   Problem List Items Addressed This Visit       Cardiovascular and Mediastinum   Paroxysmal atrial fibrillation (HCC) - Primary   Chronic, stable. Continue to collaborate with cardiology, recent note reviewed. ?some atrial fib presenting.  Continue Metoprolol  daily and recommend he continue Baby ASA daily.  Zio overall reassuring recently. Is to have echo upcoming.  Strict ER precautions given. He refuses to start statin therapy, lengthy discussion had with him on risks of not taking - such as stroke or MI. He reports understanding these risks.        Other   Mixed hyperlipidemia   Chronic, ongoing. He refuses to start statin therapy, lengthy discussion had with him on risks of not taking - such as stroke or  MI. He reports understanding these risks. The 10-year ASCVD risk score (Arnett DK, et al., 2019) is: 10.2%   Values used to calculate the score:     Age: 67 years     Clincally relevant sex: Male     Is Non-Hispanic African American: Yes     Diabetic: No     Tobacco smoker: No     Systolic Blood Pressure: 118 mmHg     Is BP treated: Yes     HDL Cholesterol: 48 mg/dL     Total Cholesterol: 212 mg/dL       Chest pain   Intermittent and ongoing, history of atrial fib. Continue to collaborate with cardiology.  Recent Zio with no a-fib noted. Recommend he continue Baby ASA daily. Low suspicion for GERD as episode is seconds to minutes in length, resolves on own. Recommend he attend upcoming echo and ensure to follow-up with cardiology. He refuses to start statin therapy, lengthy discussion had with him on risks of not taking - such as stroke or MI. He reports understanding these risks.        Follow up plan: Return in about 8 weeks (around 08/03/2024) for Chest Pain.

## 2024-06-08 NOTE — Assessment & Plan Note (Signed)
 Intermittent and ongoing, history of atrial fib. Continue to collaborate with cardiology.  Recent Zio with no a-fib noted. Recommend he continue Baby ASA daily. Low suspicion for GERD as episode is seconds to minutes in length, resolves on own. Recommend he attend upcoming echo and ensure to follow-up with cardiology. He refuses to start statin therapy, lengthy discussion had with him on risks of not taking - such as stroke or MI. He reports understanding these risks.

## 2024-06-22 ENCOUNTER — Ambulatory Visit

## 2024-06-22 ENCOUNTER — Ambulatory Visit: Payer: Self-pay

## 2024-06-22 DIAGNOSIS — I480 Paroxysmal atrial fibrillation: Secondary | ICD-10-CM

## 2024-06-22 DIAGNOSIS — I479 Paroxysmal tachycardia, unspecified: Secondary | ICD-10-CM

## 2024-06-22 LAB — ECHOCARDIOGRAM COMPLETE
AR max vel: 2.45 cm2
AV Area VTI: 2.57 cm2
AV Area mean vel: 2.43 cm2
AV Mean grad: 2 mmHg
AV Peak grad: 3.8 mmHg
Ao pk vel: 0.98 m/s
Area-P 1/2: 4.49 cm2
S' Lateral: 2.7 cm

## 2024-08-07 NOTE — Patient Instructions (Incomplete)
 Be Involved in Caring For Your Health:  Taking Medications When medications are taken as directed, they can greatly improve your health. But if they are not taken as prescribed, they may not work. In some cases, not taking them correctly can be harmful. To help ensure your treatment remains effective and safe, understand your medications and how to take them. Bring your medications to each visit for review by your provider.  Your lab results, notes, and after visit summary will be available on My Chart. We strongly encourage you to use this feature. If lab results are abnormal the clinic will contact you with the appropriate steps. If the clinic does not contact you assume the results are satisfactory. You can always view your results on My Chart. If you have questions regarding your health or results, please contact the clinic during office hours. You can also ask questions on My Chart.  We at [facility] are grateful that you chose [place] to provide your care. We strive to provide evidence-based and compassionate care and are always looking for feedback. If you get a survey from the clinic please complete this so we can hear your opinions.  Heart-Healthy Eating Plan Many factors influence your heart health, including eating and exercise habits. Heart health is also called coronary health. Coronary risk increases with abnormal blood fat (lipid) levels. A heart-healthy eating plan includes limiting unhealthy fats, increasing healthy fats, limiting salt (sodium) intake, and making other diet and lifestyle changes. What is my plan? Your health care provider may recommend that: You limit your fat intake to _________% or less of your total calories each day. You limit your saturated fat intake to _________% or less of your total calories each day. You limit the amount of cholesterol in your diet to less than _________ mg per day. You limit the amount of sodium in your diet to less than _________  mg per day. What are tips for following this plan? Cooking Cook foods using methods other than frying. Baking, boiling, grilling, and broiling are all good options. Other ways to reduce fat include: Removing the skin from poultry. Removing all visible fats from meats. Steaming vegetables in water or broth. Meal planning  At meals, imagine dividing your plate into fourths: Fill one-half of your plate with vegetables and green salads. Fill one-fourth of your plate with whole grains. Fill one-fourth of your plate with lean protein foods. Eat 2-4 cups of vegetables per day. One cup of vegetables equals 1 cup (91 g) broccoli or cauliflower florets, 2 medium carrots, 1 large bell pepper, 1 large sweet potato, 1 large tomato, 1 medium white potato, 2 cups (150 g) raw leafy greens. Eat 1-2 cups of fruit per day. One cup of fruit equals 1 small apple, 1 large banana, 1 cup (237 g) mixed fruit, 1 large orange,  cup (82 g) dried fruit, 1 cup (240 mL) 100% fruit juice. Eat more foods that contain soluble fiber. Examples include apples, broccoli, carrots, beans, peas, and barley. Aim to get 25-30 g of fiber per day. Increase your consumption of legumes, nuts, and seeds to 4-5 servings per week. One serving of dried beans or legumes equals  cup (90 g) cooked, 1 serving of nuts is  oz (12 almonds, 24 pistachios, or 7 walnut halves), and 1 serving of seeds equals  oz (8 g). Fats Choose healthy fats more often. Choose monounsaturated and polyunsaturated fats, such as olive and canola oils, avocado oil, flaxseeds, walnuts, almonds, and seeds. Eat  more omega-3 fats. Choose salmon, mackerel, sardines, tuna, flaxseed oil, and ground flaxseeds. Aim to eat fish at least 2 times each week. Check food labels carefully to identify foods with trans fats or high amounts of saturated fat. Limit saturated fats. These are found in animal products, such as meats, butter, and cream. Plant sources of saturated fats  include palm oil, palm kernel oil, and coconut oil. Avoid foods with partially hydrogenated oils in them. These contain trans fats. Examples are stick margarine, some tub margarines, cookies, crackers, and other baked goods. Avoid fried foods. General information Eat more home-cooked food and less restaurant, buffet, and fast food. Limit or avoid alcohol. Limit foods that are high in added sugar and simple starches such as foods made using white refined flour (white breads, pastries, sweets). Lose weight if you are overweight. Losing just 5-10% of your body weight can help your overall health and prevent diseases such as diabetes and heart disease. Monitor your sodium intake, especially if you have high blood pressure. Talk with your health care provider about your sodium intake. Try to incorporate more vegetarian meals weekly. What foods should I eat? Fruits All fresh, canned (in natural juice), or frozen fruits. Vegetables Fresh or frozen vegetables (raw, steamed, roasted, or grilled). Green salads. Grains Most grains. Choose whole wheat and whole grains most of the time. Rice and pasta, including brown rice and pastas made with whole wheat. Meats and other proteins Lean, well-trimmed beef, veal, pork, and lamb. Chicken and [place] without skin. All fish and shellfish. Wild duck, rabbit, pheasant, and venison. Egg whites or low-cholesterol egg substitutes. Dried beans, peas, lentils, and tofu. Seeds and most nuts. Dairy Low-fat or nonfat cheeses, including ricotta and mozzarella. Skim or 1% milk (liquid, powdered, or evaporated). Buttermilk made with low-fat milk. Nonfat or low-fat yogurt. Fats and oils Non-hydrogenated (trans-free) margarines. Vegetable oils, including soybean, sesame, sunflower, olive, avocado, peanut, safflower, corn, canola, and cottonseed. Salad dressings or mayonnaise made with a vegetable oil. Beverages Water (mineral or sparkling). Coffee and tea. Unsweetened ice  tea. Diet beverages. Sweets and desserts Sherbet, gelatin, and fruit ice. Small amounts of dark chocolate. Limit all sweets and desserts. Seasonings and condiments All seasonings and condiments. The items listed above may not be a complete list of foods and beverages you can eat. Contact a dietitian for more options. What foods should I avoid? Fruits Canned fruit in heavy syrup. Fruit in cream or butter sauce. Fried fruit. Limit coconut. Vegetables Vegetables cooked in cheese, cream, or butter sauce. Fried vegetables. Grains Breads made with saturated or trans fats, oils, or whole milk. Croissants. Sweet rolls. Donuts. High-fat crackers, such as cheese crackers and chips. Meats and other proteins Fatty meats, such as hot dogs, ribs, sausage, bacon, rib-eye roast or steak. High-fat deli meats, such as salami and bologna. Caviar. Domestic duck and goose. Organ meats, such as liver. Dairy Cream, sour cream, cream cheese, and creamed cottage cheese. Whole-milk cheeses. Whole or 2% milk (liquid, evaporated, or condensed). Whole buttermilk. Cream sauce or high-fat cheese sauce. Whole-milk yogurt. Fats and oils Meat fat, or shortening. Cocoa butter, hydrogenated oils, palm oil, coconut oil, palm kernel oil. Solid fats and shortenings, including bacon fat, salt pork, lard, and butter. Nondairy cream substitutes. Salad dressings with cheese or sour cream. Beverages Regular sodas and any drinks with added sugar. Sweets and desserts Frosting. Pudding. Cookies. Cakes. Pies. Milk chocolate or white chocolate. Buttered syrups. Full-fat ice cream or ice cream drinks. The items listed above may  not be a complete list of foods and beverages to avoid. Contact a dietitian for more information. Summary Heart-healthy meal planning includes limiting unhealthy fats, increasing healthy fats, limiting salt (sodium) intake and making other diet and lifestyle changes. Lose weight if you are overweight. Losing just  5-10% of your body weight can help your overall health and prevent diseases such as diabetes and heart disease. Focus on eating a balance of foods, including fruits and vegetables, low-fat or nonfat dairy, lean protein, nuts and legumes, whole grains, and heart-healthy oils and fats. This information is not intended to replace advice given to you by your health care provider. Make sure you discuss any questions you have with your health care provider. Document Revised: 09/10/2021 Document Reviewed: 09/10/2021 Elsevier Patient Education  2024 [employer].

## 2024-08-10 ENCOUNTER — Ambulatory Visit: Admitting: Nurse Practitioner

## 2024-08-10 ENCOUNTER — Encounter: Payer: Self-pay | Admitting: Nurse Practitioner

## 2024-08-10 VITALS — BP 124/83 | HR 71 | Temp 98.0°F | Resp 16 | Ht 70.0 in | Wt 230.0 lb

## 2024-08-10 DIAGNOSIS — J069 Acute upper respiratory infection, unspecified: Secondary | ICD-10-CM | POA: Diagnosis not present

## 2024-08-10 DIAGNOSIS — R079 Chest pain, unspecified: Secondary | ICD-10-CM

## 2024-08-10 DIAGNOSIS — I480 Paroxysmal atrial fibrillation: Secondary | ICD-10-CM | POA: Diagnosis not present

## 2024-08-10 NOTE — Assessment & Plan Note (Signed)
 Chronic, stable. Continue to collaborate with cardiology, recent note reviewed. ?some atrial fib presenting.  Continue Metoprolol  daily and recommend he continue Baby ASA daily.  Zio and echo recently were reassuring.  Strict ER precautions given. He refuses to start statin therapy, lengthy discussion had with him on risks of not taking - such as stroke or MI. He reports understanding these risks.

## 2024-08-10 NOTE — Assessment & Plan Note (Addendum)
 Acute for 24 hours. Wishes to defer Covid or Flu testing. We discussed viral illnesses often run their course within 5-7 days. Unfortunately, antibiotics don't work against viruses and just increase risk of other issues such as diarrhea, yeast infections, and resistant infections.  If starts feeling worse with facial pain, high fever, cough, shortness of breath to immediately go to ER or call office. If ongoing symptoms in one week to alert PCP and will consider abx therapy at that time. Some things that can make you feel better are: - Increased rest - Increasing Fluids - Acetaminophen as needed for fever/pain.  - Salt water gargling, chloraseptic spray and throat lozenges - OTC Coricidin - Mucinex.  - Saline sinus flushes or a neti pot.  - Humidifying the air.

## 2024-08-10 NOTE — Assessment & Plan Note (Signed)
 Intermittent and ongoing, history of atrial fib. Continue to collaborate with cardiology.  Recent Zio with no a-fib noted and echo reassuring. Recommend he continue Baby ASA daily. He refuses to start statin therapy, lengthy discussion had with him on risks of not taking - such as stroke or MI. He reports understanding these risks. - Will trial a low dose of Pepcid daily to see if discomfort related to reflux. If no improvement consider return to cardiology or possibly testing for asthma

## 2024-08-10 NOTE — Progress Notes (Signed)
 "  BP 124/83 (BP Location: Left Arm, Patient Position: Sitting, Cuff Size: Large)   Pulse 71   Temp 98 F (36.7 C) (Oral)   Resp 16   Ht 5' 10 (1.778 m)   Wt 230 lb (104.3 kg)   SpO2 96%   BMI 33.00 kg/m    Subjective:    Patient ID: Harold [name], male    DOB: [birth year], 58 y.o.   MRN: [id number]  HPI: Harold [name] is a 58 y.o. male  Chief Complaint  Patient presents with   Follow-up    Follow up for chest pain. Pain level is at a 1 today he believes that he may have a cold.   UPPER RESPIRATORY TRACT INFECTION Started to feel bad yesterday. Fever: no Cough: yes a little when lies down Shortness of breath: no Wheezing: yes Chest pain: see below Chest tightness: small amount Chest congestion: no Nasal congestion: yes Runny nose: yes Post nasal drip: yes Sneezing: no Sore throat: no Swollen glands: no Sinus pressure: yes Headache: yes Face pain: yes Toothache: no Ear pain: none Ear pressure: none Eyes red/itching:yes Eye drainage/crusting: no  Vomiting: no Rash: no Fatigue: somewhat Sick contacts: no Strep contacts: no  Context: stable Recurrent sinusitis: no Relief with OTC cold/cough medications: yes  Treatments attempted: Dayquil    PALPITATIONS AND CP Had visit with cardiology on 05/07/24. Echo on 05/07/24 was reassuring. The recommended start Crestor 5 MG.  Overall no other changes made.  He continues to refuse statin therapy, does not like to take medication.  We discussed risks of not taking. Continues to have discomfort to sternal area. Goes for walk in morning there will be some in the first 15 minutes and then goes away. Then randomly occurs otherwise. Not present every day.  Reports pain as a 3/10. Aching type of pain. When it comes he feels it with his heart beat. Duration: months Symptom description: as above Duration of episode: as above Frequency: recurrent Activity when event occurred: as above Related to exertion: as  above Dyspnea: no Chest pain: yes Syncope: no Anxiety/stress: no Nausea/vomiting: no Diaphoresis: unsure Coronary artery disease: history of a-fib Congestive heart failure: no Arrhythmia: history of a-fib Thyroid  disease: no Caffeine intake: limits caffeine at baseline due to IBS Status:  stable Treatments attempted:ASA and BB      08/10/2024    8:37 AM 06/08/2024   10:41 AM 04/27/2024    1:11 PM 11/25/2023    1:49 PM 10/14/2023   10:55 AM  Depression screen PHQ 2/9  Decreased Interest 0 1 1 1  0  Down, Depressed, Hopeless 0 0 1 1 0  PHQ - 2 Score 0 1 2 2  0  Altered sleeping 1 0 0 0 1  Tired, decreased energy 1 1 1 1 1   Change in appetite 1 0 1 0 1  Feeling bad or failure about yourself  0 1 1 0 0  Trouble concentrating 0 0 1 0 1  Moving slowly or fidgety/restless 0 0 0 0 0  Suicidal thoughts 1 0 0 0 0  PHQ-9 Score 4 3  6  3  4    Difficult doing work/chores  Not difficult at all Somewhat difficult Not difficult at all Not difficult at all     Data saved with a previous flowsheet row definition       08/10/2024    8:37 AM 06/08/2024   10:41 AM 04/27/2024    1:12 PM 11/25/2023  1:49 PM  GAD 7 : Generalized Anxiety Score  Nervous, Anxious, on Edge 1 0 1 0  Control/stop worrying 1 0 1 0  Worry too much - different things 1 0 0 0  Trouble relaxing 1 0 0 1  Restless 0 0 0 0  Easily annoyed or irritable 0 0 0 0  Afraid - awful might happen 1 1 1 1   Total GAD 7 Score 5 1 3 2   Anxiety Difficulty  Not difficult at all Somewhat difficult Not difficult at all   Relevant past medical, surgical, family and social history reviewed and updated as indicated. Interim medical history since our last visit reviewed. Allergies and medications reviewed and updated.  Review of Systems  Constitutional:  Positive for fatigue. Negative for activity change, diaphoresis and fever.  HENT:  Positive for congestion, postnasal drip, rhinorrhea and sinus pressure. Negative for ear discharge, ear  pain, sinus pain and sore throat.   Respiratory:  Positive for cough and wheezing. Negative for chest tightness and shortness of breath.   Cardiovascular:  Positive for chest pain and palpitations. Negative for leg swelling.  Gastrointestinal: Negative.   Neurological:  Positive for headaches.  Psychiatric/Behavioral: Negative.      Per HPI unless specifically indicated above     Objective:    BP 124/83 (BP Location: Left Arm, Patient Position: Sitting, Cuff Size: Large)   Pulse 71   Temp 98 F (36.7 C) (Oral)   Resp 16   Ht 5' 10 (1.778 m)   Wt 230 lb (104.3 kg)   SpO2 96%   BMI 33.00 kg/m   Wt Readings from Last 3 Encounters:  08/10/24 230 lb (104.3 kg)  06/08/24 222 lb 12.8 oz (101.1 kg)  05/07/24 223 lb (101.2 kg)    Physical Exam Vitals and nursing note reviewed.  Constitutional:      General: He is awake. He is not in acute distress.    Appearance: He is well-developed and well-groomed. He is obese. He is not ill-appearing or toxic-appearing.  HENT:     Head: Normocephalic.     Right Ear: Hearing, ear canal and external ear normal. A middle ear effusion is present. There is no impacted cerumen. Tympanic membrane is not injected.     Left Ear: Hearing, ear canal and external ear normal. A middle ear effusion is present. There is no impacted cerumen. Tympanic membrane is not injected.     Nose: Rhinorrhea present. Rhinorrhea is clear.     Right Sinus: No maxillary sinus tenderness or frontal sinus tenderness.     Left Sinus: No maxillary sinus tenderness or frontal sinus tenderness.     Mouth/Throat:     Mouth: Mucous membranes are moist.     Pharynx: Posterior oropharyngeal erythema and postnasal drip present. No pharyngeal swelling.  Eyes:     General: Lids are normal.     Extraocular Movements: Extraocular movements intact.     Conjunctiva/sclera: Conjunctivae normal.  Neck:     Thyroid : No thyromegaly.     Vascular: No carotid bruit.  Cardiovascular:      Rate and Rhythm: Normal rate and regular rhythm.     Heart sounds: Normal heart sounds. No murmur heard.    No gallop.  Pulmonary:     Effort: No accessory muscle usage or respiratory distress.     Breath sounds: Normal breath sounds. No decreased breath sounds, wheezing or rales.  Abdominal:     General: Bowel sounds are normal. There is no  distension.     Palpations: Abdomen is soft.     Tenderness: There is no abdominal tenderness.  Musculoskeletal:     Cervical back: Full passive range of motion without pain.     Right lower leg: No edema.     Left lower leg: No edema.  Lymphadenopathy:     Head:     Right side of head: No submental, submandibular, tonsillar, preauricular or posterior auricular adenopathy.     Left side of head: No submental, submandibular, tonsillar, preauricular or posterior auricular adenopathy.     Cervical: No cervical adenopathy.  Skin:    General: Skin is warm.     Capillary Refill: Capillary refill takes less than 2 seconds.  Neurological:     Mental Status: He is alert and oriented to person, place, and time.     Deep Tendon Reflexes: Reflexes are normal and symmetric.     Reflex Scores:      Brachioradialis reflexes are 2+ on the right side and 2+ on the left side.      Patellar reflexes are 2+ on the right side and 2+ on the left side. Psychiatric:        Attention and Perception: Attention normal.        Mood and Affect: Mood normal.        Speech: Speech normal.        Behavior: Behavior normal. Behavior is cooperative.        Thought Content: Thought content normal.     Results for orders placed or performed in visit on 06/22/24  ECHOCARDIOGRAM COMPLETE   Collection Time: 06/22/24 11:49 AM  Result Value Ref Range   AR max vel 2.45 cm2   AV Peak grad 3.8 mmHg   Ao pk vel 0.98 m/s   S' Lateral 2.70 cm   Area-P 1/2 4.49 cm2   AV Area VTI 2.57 cm2   AV Mean grad 2.0 mmHg   AV Area mean vel 2.43 cm2   Est EF 60 - 65%       Assessment &  Plan:   Problem List Items Addressed This Visit       Cardiovascular and Mediastinum   Paroxysmal atrial fibrillation (HCC) - Primary   Chronic, stable. Continue to collaborate with cardiology, recent note reviewed. ?some atrial fib presenting.  Continue Metoprolol  daily and recommend he continue Baby ASA daily.  Zio and echo recently were reassuring.  Strict ER precautions given. He refuses to start statin therapy, lengthy discussion had with him on risks of not taking - such as stroke or MI. He reports understanding these risks.        Respiratory   URI (upper respiratory infection)   Acute for 24 hours. Wishes to defer Covid or Flu testing. We discussed viral illnesses often run their course within 5-7 days. Unfortunately, antibiotics don't work against viruses and just increase risk of other issues such as diarrhea, yeast infections, and resistant infections.  If starts feeling worse with facial pain, high fever, cough, shortness of breath to immediately go to ER or call office. If ongoing symptoms in one week to alert PCP and will consider abx therapy at that time. Some things that can make you feel better are: - Increased rest - Increasing Fluids - Acetaminophen as needed for fever/pain.  - Salt water gargling, chloraseptic spray and throat lozenges - OTC Coricidin - Mucinex.  - Saline sinus flushes or a neti pot.  - Humidifying the air.  Other   Chest pain   Intermittent and ongoing, history of atrial fib. Continue to collaborate with cardiology.  Recent Zio with no a-fib noted and echo reassuring. Recommend he continue Baby ASA daily. He refuses to start statin therapy, lengthy discussion had with him on risks of not taking - such as stroke or MI. He reports understanding these risks. - Will trial a low dose of Pepcid daily to see if discomfort related to reflux. If no improvement consider return to cardiology or possibly testing for asthma        Follow up  plan: Return in about 4 weeks (around 09/07/2024) for CHEST PAIN.      "

## 2024-09-11 NOTE — Patient Instructions (Incomplete)
 Chest Wall Pain Chest wall pain is pain in or around the bones and muscles of your chest. Sometimes, an injury causes this pain. Excessive coughing or overuse of arm and chest muscles may also cause chest wall pain. Sometimes, the cause may not be known. This pain may take several weeks or longer to get better. Follow these instructions at home: Managing pain, stiffness, and swelling  If directed, put ice on the painful area: Put ice in a plastic bag. Place a towel between your skin and the bag. Leave the ice on for 20 minutes, 2-3 times per day. Activity Rest as told by your health care provider. Avoid activities that cause pain. These include any activities that use your chest muscles or your abdominal and side muscles to lift heavy items. Ask your health care provider what activities are safe for you. General instructions  Take over-the-counter and prescription medicines only as told by your health care provider. Do not use any products that contain nicotine or tobacco, such as cigarettes, e-cigarettes, and chewing tobacco. These can delay healing after injury. If you need help quitting, ask your health care provider. Keep all follow-up visits as told by your health care provider. This is important. Contact a health care provider if: You have a fever. Your chest pain becomes worse. You have new symptoms. Get help right away if: You have nausea or vomiting. You feel sweaty or light-headed. You have a cough with mucus from your lungs (sputum) or you cough up blood. You develop shortness of breath. These symptoms may represent a serious problem that is an emergency. Do not wait to see if the symptoms will go away. Get medical help right away. Call your local emergency services (911 in the [place] Do not drive yourself to the hospital. Summary Chest wall pain is pain in or around the bones and muscles of your chest. Depending on the cause, it may be treated with ice, rest, medicines, and  avoiding activities that cause pain. Contact a health care provider if you have a fever, worsening chest pain, or new symptoms. Get help right away if you feel light-headed or you develop shortness of breath. These symptoms may be an emergency. This information is not intended to replace advice given to you by your health care provider. Make sure you discuss any questions you have with your health care provider. Document Revised: 07/29/2022 Document Reviewed: 07/29/2022 Elsevier Patient Education  2024 [employer].

## 2024-09-13 ENCOUNTER — Ambulatory Visit: Admitting: Nurse Practitioner

## 2024-09-18 NOTE — Patient Instructions (Signed)
 Healthy Eating for Your Heart Many factors influence your heart health, including eating and exercise habits. Heart health is also called coronary health. Coronary risk increases with abnormal blood fat (lipid) levels. A heart-healthy eating plan includes limiting unhealthy fats, increasing healthy fats, limiting salt (sodium) intake, and making other diet and lifestyle changes. What is my plan? Your health care provider may recommend that: You limit your fat intake to _________% or less of your total calories each day. You limit your saturated fat intake to _________% or less of your total calories each day. You limit the amount of cholesterol in your diet to less than _________ mg per day. You limit the amount of sodium in your diet to less than _________ mg per day. What are tips for following this plan? Cooking Cook foods using methods other than frying. Baking, boiling, grilling, and broiling are all good options. Other ways to reduce fat include: Removing the skin from poultry. Removing all visible fats from meats. Steaming vegetables in water or broth. Meal planning  At meals, imagine dividing your plate into fourths: Fill one-half of your plate with vegetables and green salads. Fill one-fourth of your plate with whole grains. Fill one-fourth of your plate with lean protein foods. Eat 2-4 cups of vegetables per day. One cup of vegetables equals 1 cup (91 g) broccoli or cauliflower florets, 2 medium carrots, 1 large bell pepper, 1 large sweet potato, 1 large tomato, 1 medium white potato, 2 cups (150 g) raw leafy greens. Eat 1-2 cups of fruit per day. One cup of fruit equals 1 small apple, 1 large banana, 1 cup (237 g) mixed fruit, 1 large orange,  cup (82 g) dried fruit, 1 cup (240 mL) 100% fruit juice. Eat more foods that contain soluble fiber. Examples include apples, broccoli, carrots, beans, peas, and barley. Aim to get 25-30 g of fiber per day. Increase your consumption of  legumes, nuts, and seeds to 4-5 servings per week. One serving of dried beans or legumes equals  cup (90 g) cooked, 1 serving of nuts is  oz (12 almonds, 24 pistachios, or 7 walnut halves), and 1 serving of seeds equals  oz (8 g). Fats Choose healthy fats more often. Choose monounsaturated and polyunsaturated fats, such as olive and canola oils, avocado oil, flaxseeds, walnuts, almonds, and seeds. Eat more omega-3 fats. Choose salmon, mackerel, sardines, tuna, flaxseed oil, and ground flaxseeds. Aim to eat fish at least 2 times each week. Check food labels carefully to identify foods with trans fats or high amounts of saturated fat. Limit saturated fats. These are found in animal products, such as meats, butter, and cream. Plant sources of saturated fats include palm oil, palm kernel oil, and coconut oil. Avoid foods with partially hydrogenated oils in them. These contain trans fats. Examples are stick margarine, some tub margarines, cookies, crackers, and other baked goods. Avoid fried foods. General information Eat more home-cooked food and less restaurant, buffet, and fast food. Limit or avoid alcohol. Limit foods that are high in added sugar and simple starches such as foods made using white refined flour (white breads, pastries, sweets). Lose weight if you are overweight. Losing just 5-10% of your body weight can help your overall health and prevent diseases such as diabetes and heart disease. Monitor your sodium intake, especially if you have high blood pressure. Talk with your health care provider about your sodium intake. Try to incorporate more vegetarian meals weekly. What foods should I eat? Fruits All fresh, canned (  in natural juice), or frozen fruits. Vegetables Fresh or frozen vegetables (raw, steamed, roasted, or grilled). Green salads. Grains Most grains. Choose whole wheat and whole grains most of the time. Rice and pasta, including brown rice and pastas made with whole  wheat. Meats and other proteins Lean, well-trimmed beef, veal, pork, and lamb. Chicken and turkey without skin. All fish and shellfish. Wild duck, rabbit, pheasant, and venison. Egg whites or low-cholesterol egg substitutes. Dried beans, peas, lentils, and tofu. Seeds and most nuts. Dairy Low-fat or nonfat cheeses, including ricotta and mozzarella. Skim or 1% milk (liquid, powdered, or evaporated). Buttermilk made with low-fat milk. Nonfat or low-fat yogurt. Fats and oils Non-hydrogenated (trans-free) margarines. Vegetable oils, including soybean, sesame, sunflower, olive, avocado, peanut, safflower, corn, canola, and cottonseed. Salad dressings or mayonnaise made with a vegetable oil. Beverages Water (mineral or sparkling). Coffee and tea. Unsweetened ice tea. Diet beverages. Sweets and desserts Sherbet, gelatin, and fruit ice. Small amounts of dark chocolate. Limit all sweets and desserts. Seasonings and condiments All seasonings and condiments. The items listed above may not be a complete list of foods and beverages you can eat. Contact a dietitian for more options. What foods should I avoid? Fruits Canned fruit in heavy syrup. Fruit in cream or butter sauce. Fried fruit. Limit coconut. Vegetables Vegetables cooked in cheese, cream, or butter sauce. Fried vegetables. Grains Breads made with saturated or trans fats, oils, or whole milk. Croissants. Sweet rolls. Donuts. High-fat crackers, such as cheese crackers and chips. Meats and other proteins Fatty meats, such as hot dogs, ribs, sausage, bacon, rib-eye roast or steak. High-fat deli meats, such as salami and bologna. Caviar. Domestic duck and goose. Organ meats, such as liver. Dairy Cream, sour cream, cream cheese, and creamed cottage cheese. Whole-milk cheeses. Whole or 2% milk (liquid, evaporated, or condensed). Whole buttermilk. Cream sauce or high-fat cheese sauce. Whole-milk yogurt. Fats and oils Meat fat, or shortening. Cocoa  butter, hydrogenated oils, palm oil, coconut oil, palm kernel oil. Solid fats and shortenings, including bacon fat, salt pork, lard, and butter. Nondairy cream substitutes. Salad dressings with cheese or sour cream. Beverages Regular sodas and any drinks with added sugar. Sweets and desserts Frosting. Pudding. Cookies. Cakes. Pies. Milk chocolate or white chocolate. Buttered syrups. Full-fat ice cream or ice cream drinks. The items listed above may not be a complete list of foods and beverages to avoid. Contact a dietitian for more information. Summary Heart-healthy meal planning includes limiting unhealthy fats, increasing healthy fats, limiting salt (sodium) intake and making other diet and lifestyle changes. Lose weight if you are overweight. Losing just 5-10% of your body weight can help your overall health and prevent diseases such as diabetes and heart disease. Focus on eating a balance of foods, including fruits and vegetables, low-fat or nonfat dairy, lean protein, nuts and legumes, whole grains, and heart-healthy oils and fats. This information is not intended to replace advice given to you by your health care provider. Make sure you discuss any questions you have with your health care provider. Document Revised: 06/14/2024 Document Reviewed: 09/10/2021 [employer] Patient Education  2025 [employer].

## 2024-09-21 ENCOUNTER — Ambulatory Visit (INDEPENDENT_AMBULATORY_CARE_PROVIDER_SITE_OTHER): Admitting: Nurse Practitioner

## 2024-09-21 ENCOUNTER — Encounter: Payer: Self-pay | Admitting: Nurse Practitioner

## 2024-09-21 VITALS — BP 111/76 | HR 60 | Temp 97.7°F | Ht 70.0 in | Wt 221.2 lb

## 2024-09-21 DIAGNOSIS — E782 Mixed hyperlipidemia: Secondary | ICD-10-CM

## 2024-09-21 DIAGNOSIS — R079 Chest pain, unspecified: Secondary | ICD-10-CM | POA: Diagnosis not present

## 2024-09-21 DIAGNOSIS — I10 Essential (primary) hypertension: Secondary | ICD-10-CM | POA: Diagnosis not present

## 2024-09-21 NOTE — Assessment & Plan Note (Signed)
 Chronic, BP at goal today. Continue Amlodipine , Valsartan  and Metoprolol . Discussed with him that in future we may be able to minimize again, but for now need to control BP.  Recommend he monitor BP at least a few mornings a week at home and document. DASH diet at home. Labs today: up to date.

## 2024-12-20 ENCOUNTER — Ambulatory Visit: Admitting: Nurse Practitioner
